# Patient Record
Sex: Male | Born: 1965
Health system: Southern US, Community
[De-identification: ages and names within clinical notes are randomized; demographics above are authoritative.]

## PROBLEM LIST (undated history)

## (undated) DIAGNOSIS — E785 Hyperlipidemia, unspecified: Secondary | ICD-10-CM

## (undated) DIAGNOSIS — K219 Gastro-esophageal reflux disease without esophagitis: Secondary | ICD-10-CM

## (undated) DIAGNOSIS — I2510 Atherosclerotic heart disease of native coronary artery without angina pectoris: Secondary | ICD-10-CM

## (undated) DIAGNOSIS — I10 Essential (primary) hypertension: Secondary | ICD-10-CM

## (undated) DIAGNOSIS — I82409 Acute embolism and thrombosis of unspecified deep veins of unspecified lower extremity: Secondary | ICD-10-CM

## (undated) DIAGNOSIS — Z86711 Personal history of pulmonary embolism: Secondary | ICD-10-CM

## (undated) DIAGNOSIS — I440 Atrioventricular block, first degree: Secondary | ICD-10-CM

## (undated) DIAGNOSIS — J309 Allergic rhinitis, unspecified: Secondary | ICD-10-CM

## (undated) DIAGNOSIS — E8881 Metabolic syndrome: Secondary | ICD-10-CM

## (undated) DIAGNOSIS — E119 Type 2 diabetes mellitus without complications: Secondary | ICD-10-CM

## (undated) DIAGNOSIS — A770 Spotted fever due to Rickettsia rickettsii: Secondary | ICD-10-CM

## (undated) HISTORY — DX: Allergic rhinitis, unspecified: J30.9

## (undated) HISTORY — DX: Atherosclerotic heart disease of native coronary artery without angina pectoris: I25.10

## (undated) HISTORY — DX: Spotted fever due to Rickettsia rickettsii: A77.0

## (undated) HISTORY — DX: Metabolic syndrome: E88.81

## (undated) HISTORY — PX: TIBIA FRACTURE SURGERY: SHX806

## (undated) HISTORY — DX: Type 2 diabetes mellitus without complications: E11.9

## (undated) HISTORY — DX: Atrioventricular block, first degree: I44.0

## (undated) HISTORY — DX: Gastro-esophageal reflux disease without esophagitis: K21.9

## (undated) HISTORY — DX: Personal history of pulmonary embolism: Z86.711

## (undated) HISTORY — DX: Metabolic syndrome: E88.810

---

## 2001-05-08 ENCOUNTER — Inpatient Hospital Stay (HOSPITAL_COMMUNITY): Admission: RE | Admit: 2001-05-08 | Discharge: 2001-05-11 | Payer: Self-pay | Admitting: *Deleted

## 2002-04-24 ENCOUNTER — Encounter: Admission: RE | Admit: 2002-04-24 | Discharge: 2002-05-17 | Payer: Self-pay | Admitting: Internal Medicine

## 2002-07-03 ENCOUNTER — Encounter: Admission: RE | Admit: 2002-07-03 | Discharge: 2002-10-01 | Payer: Self-pay | Admitting: Internal Medicine

## 2003-06-25 ENCOUNTER — Encounter: Admission: RE | Admit: 2003-06-25 | Discharge: 2003-09-23 | Payer: Self-pay | Admitting: Internal Medicine

## 2007-01-27 ENCOUNTER — Emergency Department (HOSPITAL_COMMUNITY): Admission: EM | Admit: 2007-01-27 | Discharge: 2007-01-27 | Payer: Self-pay | Admitting: Emergency Medicine

## 2009-02-11 ENCOUNTER — Ambulatory Visit: Payer: Self-pay | Admitting: Vascular Surgery

## 2009-02-15 ENCOUNTER — Ambulatory Visit (HOSPITAL_COMMUNITY): Admission: RE | Admit: 2009-02-15 | Discharge: 2009-02-15 | Payer: Self-pay | Admitting: Internal Medicine

## 2009-02-15 ENCOUNTER — Ambulatory Visit: Payer: Self-pay | Admitting: Pulmonary Disease

## 2009-02-15 ENCOUNTER — Emergency Department (HOSPITAL_COMMUNITY): Admission: EM | Admit: 2009-02-15 | Discharge: 2009-02-15 | Payer: Self-pay | Admitting: Emergency Medicine

## 2009-10-03 ENCOUNTER — Ambulatory Visit: Payer: Self-pay | Admitting: Vascular Surgery

## 2010-11-09 LAB — DIFFERENTIAL
Basophils Absolute: 0.1 10*3/uL (ref 0.0–0.1)
Eosinophils Absolute: 0.1 10*3/uL (ref 0.0–0.7)
Eosinophils Relative: 2 % (ref 0–5)
Lymphocytes Relative: 36 % (ref 12–46)
Neutrophils Relative %: 57 % (ref 43–77)

## 2010-11-09 LAB — POCT I-STAT, CHEM 8
Creatinine, Ser: 0.9 mg/dL (ref 0.4–1.5)
Glucose, Bld: 111 mg/dL — ABNORMAL HIGH (ref 70–99)
HCT: 45 % (ref 39.0–52.0)
Hemoglobin: 15.3 g/dL (ref 13.0–17.0)
TCO2: 24 mmol/L (ref 0–100)

## 2010-11-09 LAB — CBC
HCT: 42.8 % (ref 39.0–52.0)
Hemoglobin: 14.6 g/dL (ref 13.0–17.0)
MCHC: 34.2 g/dL (ref 30.0–36.0)
MCV: 88.9 fL (ref 78.0–100.0)
Platelets: 192 10*3/uL (ref 150–400)
RBC: 4.82 MIL/uL (ref 4.22–5.81)
RDW: 12.8 % (ref 11.5–15.5)
WBC: 7.9 10*3/uL (ref 4.0–10.5)

## 2010-11-09 LAB — PROTIME-INR
INR: 1.3 (ref 0.00–1.49)
Prothrombin Time: 17.1 seconds — ABNORMAL HIGH (ref 11.6–15.2)

## 2010-12-16 NOTE — Procedures (Signed)
DUPLEX DEEP VENOUS EXAM - LOWER EXTREMITY   INDICATION:  Right lower extremity pain and swelling.   HISTORY:  Edema:  Yes  Trauma/Surgery:  No  Pain:  Yes  PE:  No  Previous DVT:  No  Anticoagulants:  Other:   DUPLEX EXAM:                CFV   SFV   PopV  PTV    GSV                R  L  R  L  R  L  R   L  R  L  Thrombosis    0  0  +     +     +      0  Spontaneous   +     +     +     +      +  Phasic        0     0     0     0      0  Augmentation  0     0     0     0      0  Compressible  0     0     0     0      0  Competent     0     0     0     0      0   Legend:  + - yes  o - no  p - partial  D - decreased   IMPRESSION:  Acute deep vein thrombosis noted in the right superficial  femoral vein, popliteal, peroneal, posterior tibial and gastrocnemius  veins.    _____________________________  Janetta Hora Fields, MD   MG/MEDQ  D:  02/11/2009  T:  02/12/2009  Job:  [id number]

## 2010-12-16 NOTE — Consult Note (Signed)
Adam Nunez, Adam Nunez                 ACCOUNT NO.:  [id number]   MEDICAL RECORD NO.:  [id number]          PATIENT TYPE:  EMS   LOCATION:  [facility]                         FACILITY:  [facility]   PHYSICIAN:  Tera Mater. Evlyn Kanner, M.D. DATE OF BIRTH:  1965-09-06   DATE OF CONSULTATION:  02/15/2009  DATE OF DISCHARGE:  02/15/2009                                 CONSULTATION   Mr. Cheryll Dessert is a 45 year old white male with newly discovered pulmonary  embolus.  He had been seen in our office on February 11, 2009, with leg  swelling and a vague feeling in his chest.  This had happened roughly  started 3 weeks ago from today.  When he was seen on February 11, 2009, he  had a prompt ultrasound done at [facility] which  showed acute DVT on the right, SFV, popliteal, peroneal, PTV, and  gastrocnemius veins.  In addition, he had a recent echocardiogram  showing EF of 55-60% with normal RV size and function, normal diastolic  function, and normal valves.  Today, he came in for a routine scheduled  CT just to look for pulmonary emboli and indeed multiple pulmonary  emboli were seen, specifically in the right and left main pulmonary  arteries, the left lower lobe branch, and right upper lobe,and also a CT  of the abdomen and pelvis was negative for any malignancy as was the CT  of the chest.  Upon questioning, he notes no pleuritic chest pain, no  hemoptysis, and no dyspnea.  His oxygen saturations have been fine while  observed here and he has had no arrhythmias.  He was seen briefly by Dr.  Vassie Loll of Pulmonary to help with management and possible consideration of  a research study and given all this history we have reevaluated the  situation and I think we will let him go home.  We discussed the fact  that the pulmonary emboli were still in the acute phase, but probably  happened 3 weeks ago.  There was no evidence of residual clot in the  proximal iliac veins that might go up to his lungs and he is  hemodynamically and physiologically stable without hypoxia.  He has  already been started on Lovenox 90 twice a day which has gotten for 4  days and he has been on Coumadin with an INR that is still  subtherapeutic.  The patient's father and girlfriend were in the room.  We had a long discussion about the natural history of pathophysiology  and risk of pulmonary emboli.  He does have a thrombophilia evaluation  that been sent to the lab, but is not back yet.  Given that this is not  malignancy related and he is hemodynamically stable, it is felt that in-  hospital therapy will be based for the same as out-of-hospital therapy.  He does not require any supplemental oxygen that might be modifying  factor, so at the present time I think we can let him go home with  continued outpatient therapy.  He is comfortable with this and will  proceed with this plan.  PAST MEDICAL HISTORY:  Type 2 diabetes in 2006 after IGT in 2003.  He  has hyperlipidemia and hypertension.  He has had [facility] spotted  fever in 2003.  He has had first-degree AV block previously.  He had a  right tib-fib fracture with IM nail in 2002.  Interestingly, he did not  have any clotting problems at that time.  He has a history of asthma and  gastroesophageal reflux disease.   SOCIAL HISTORY:  He is single with no tobacco use, rare alcohol.   ALLERGIES:  No known allergies.   FAMILY HISTORY:  Father is living and healthy with hypertension and knee  replacement.  Mother is healthy and living with hypertension.  Maternal  grandmother had type 2 diabetes.  He has a sister who is alive and well.  There have been no premature deaths from clotting disorders, bleeding,  or dysesthesias.   His medications include:  1. Crestor 10.  2. Benazepril 10.  3. Aspirin 81.  4. Zyrtec 10.  5. Mucinex.  6. Vitamin D.  7. Fish oil.  8. Metformin 500.  9. Lovenox and Coumadin which he has been on.   PHYSICAL EXAMINATION:  VITAL  SIGNS:  In the emergency room, blood  pressure 124/86, pulse is 86 and in sinus rhythm without PVCs or PACs,  his oxygen saturation is 96% on room air, and respiratory rate is 20 and  unlabored.  GENERAL:  We have a healthy-appearing white male, sitting up, in no  distress.  HEENT:  Sclerae are icteric.  Face is symmetric.  Extraocular movements  are intact.  There is no nystagmus.  Disks are sharp bilaterally.  Oral  mucous membranes are moist with no telangiectasia or evidence of  bleeding.  NECK:  Supple without thyromegaly, bruits, or JVD.  LUNGS:  Entirely clear with no wheezes, rales, or rhonchi.  I hear no  abnormal sounds and he is moving air well.  No accessory muscle in use.  HEART:  Regular with no systolic murmur or other extra sounds.  ABDOMEN:  Soft and nontender with no masses.  EXTREMITIES:  Strong distal pulses.  There is a very trace amount of  edema of his right calf, but this is very minimal.  NEURO:  The patient is awake and alert.  His mentation is perfectly  good.  Speech is clear.  No tremors present.   LABORATORY DATA:  PTT was 44 seconds.  INR is 1.3.  His hemoglobin is  15.3.  Sodium 140, potassium 4.1, chloride 105, BUN 13, creatinine 0.9,  and glucose of 111.  White count 7900, hemoglobin 14.6, and platelets  192,000.   In summary, we have a 45 year old white male with acute/subacute  pulmonary emboli who is already being managed with Lovenox and Coumadin.  We are going to let him go home to continue on his 90 twice a day of  Lovenox, to be limited in activity, to follow his diabetic diet.  He has  no pain control necessary.  He will be seen on Monday for another  protime.  He is to continue his Coumadin and his other medicines as  before.  He is to call if there are any bleeding problems, any acute  dyspnea, any dizziness, or any chest pain.  The patient agrees to this  and is comfortable with this plan.           ______________________________   Tera Mater. Evlyn Kanner, M.D.     SAS/MEDQ  D:  02/15/2009  T:  02/16/2009  Job:  [id number]

## 2010-12-16 NOTE — Procedures (Signed)
DUPLEX DEEP VENOUS EXAM - LOWER EXTREMITY   INDICATION:  Follow up right lower extremity deep venous thrombus.   HISTORY:  Edema:  No.  Trauma/Surgery:  No.  Pain:  No.  PE:  No.  Previous DVT:  Yes.  Anticoagulants:  Yes.  Other:  No.   DUPLEX EXAM:                CFV   SFV   PopV  PTV    GSV                R  L  R  L  R  L  R   L  R  L  Thrombosis    o  o  o     +     +      o  Spontaneous   +  +  +     o     +      +  Phasic        +  +  +     o     o      +  Augmentation  +  +  +     o     o      +  Compressible  +  +  +     P     o      +  Competent     +  +  +     o     o      +   Legend:  + - yes  o - no  p - partial  D - decreased   IMPRESSION:  The right leg appears to have chronic deep venous  thrombosis in the right popliteal and one posterior tibial vein.    _____________________________  Di Kindle. Edilia Bo, M.D.   CB/MEDQ  D:  10/03/2009  T:  10/03/2009  Job:  [id number]

## 2011-01-05 ENCOUNTER — Encounter (INDEPENDENT_AMBULATORY_CARE_PROVIDER_SITE_OTHER): Payer: 59

## 2011-01-05 DIAGNOSIS — M79609 Pain in unspecified limb: Secondary | ICD-10-CM

## 2011-01-14 NOTE — Procedures (Unsigned)
DUPLEX DEEP VENOUS EXAM - LOWER EXTREMITY  INDICATION:  Right lower extremity pain.  HISTORY:  Edema:  No. Trauma/Surgery:  No. Pain:  Yes. PE:  No. Previous DVT:  Right superficial femoral vein, popliteal and posterior tibial DVT 02/11/2009. Anticoagulants:  Currently taking warfarin. Other:  DUPLEX EXAM:               CFV   SFV   PopV  PTV    GSV               R  L  R  L  R  L  R   L  R  L Thrombosis    o  o  +  o  +  o  o   o  o Spontaneous   +  +  +  +  +  +  +   + Phasic        +  +  +  +  +  +  +   + Augmentation  +  +  +  +  +  +  +   + Compressible  +  +  P  +  P  +  +   + Competent     o  +  o  o  o  +  +   +  Legend:  + - yes  o - no  p - partial  D - decreased   IMPRESSION: 1. Evidence of chronic deep venous thrombosis in the right femoral     vein and popliteal vein showing partial compressibility. 2. This is consistent with the patient's medical history. 3. Results called to Dr. Laurey Morale office.      _____________________________ Di Kindle. Edilia Bo, M.D.  EM/MEDQ  D:  01/06/2011  T:  01/06/2011  Job:  [id number]

## 2011-07-31 ENCOUNTER — Emergency Department
Admission: EM | Admit: 2011-07-31 | Discharge: 2011-07-31 | Disposition: A | Discharge status: Home / Self Care | Payer: 59 | Source: Home / Self Care | Attending: Family Medicine | Admitting: Family Medicine

## 2011-07-31 ENCOUNTER — Encounter: Payer: Self-pay | Admitting: *Deleted

## 2011-07-31 DIAGNOSIS — J069 Acute upper respiratory infection, unspecified: Secondary | ICD-10-CM

## 2011-07-31 HISTORY — DX: Essential (primary) hypertension: I10

## 2011-07-31 HISTORY — DX: Hyperlipidemia, unspecified: E78.5

## 2011-07-31 HISTORY — DX: Acute embolism and thrombosis of unspecified deep veins of unspecified lower extremity: I82.409

## 2011-07-31 MED ORDER — AMOXICILLIN 875 MG PO TABS: 875.0000 mg | ORAL_TABLET | Freq: Two times a day (BID) | ORAL | Status: AC

## 2011-07-31 MED ORDER — BENZONATATE 200 MG PO CAPS: 200.0000 mg | ORAL_CAPSULE | Freq: Every day | ORAL | Status: AC

## 2011-07-31 NOTE — ED Notes (Signed)
Pt c/o productive cough, nasal congestion and chest congestion x 4 days. Denies fever. He has taken Mucinex.

## 2011-07-31 NOTE — Discharge Instructions (Signed)
Continue Mucinex twice daily for congestion.  Increase fluid intake, rest. May use Afrin nasal spray (or generic oxymetazoline) twice daily for about 5 days.  Also recommend using saline nasal spray several times daily and/or saline nasal irrigation. Stop all antihistamines for now, and other non-prescription cough/cold preparations. Begin Amoxicillin if not improving about 5 days or if persistent fever develops. Follow-up with family doctor if not improving 7 to 10 days.

## 2011-07-31 NOTE — ED Provider Notes (Signed)
History     CSN: [id number]  Arrival date & time 07/31/11  0910   First MD Initiated Contact with Patient 07/31/11 1042      Chief Complaint  Patient presents with  . Nasal Congestion  . Cough      HPI Comments: Patient complains of approximately 4 day history of gradually progressive URI symptoms beginning with a mild sore throat (now improved), followed by progressive nasal congestion.  A cough started about 3 days ago.  Complains of fatigue but no myalgias.  Cough is now worse at night and generally productive during the day.  There has been no pleuritic pain or shortness of breath.  He does complain of wheezing at times. He has had pneumonia in the distant past.  He had a flu shot earlier this month.  He had a DVT and PE about two years ago.   Patient is a 45 y.o. male presenting with cough. The history is provided by the patient.  Cough This is a new problem. Episode onset: 4 days ago. The problem occurs every few minutes. The problem has been gradually worsening. The cough is productive of sputum. There has been no fever. Associated symptoms include ear congestion, headaches, rhinorrhea, sore throat and wheezing. Pertinent negatives include no chest pain, no chills, no sweats, no ear pain, no myalgias, no shortness of breath and no eye redness. He has tried cough syrup for the symptoms. The treatment provided mild relief. He is not a smoker. His past medical history is significant for pneumonia.    Past Medical History  Diagnosis Date  . DVT (deep venous thrombosis)   . Hypertension   . Diabetes mellitus   . Hyperlipemia     Past Surgical History  Procedure Date  . Tibia fracture surgery     Family History  Problem Relation Age of Onset  . Hypertension Mother   . Hypertension Father     History  Substance Use Topics  . Smoking status: Never Smoker   . Smokeless tobacco: Not on file  . Alcohol Use: No      Review of Systems  Constitutional: Negative for  chills.  HENT: Positive for sore throat and rhinorrhea. Negative for ear pain.   Eyes: Negative for redness.  Respiratory: Positive for cough and wheezing. Negative for shortness of breath.   Cardiovascular: Negative for chest pain.  Musculoskeletal: Negative for myalgias.  Neurological: Positive for headaches.   + sore throat (resolved) + cough No pleuritic pain ? wheezing + nasal congestion + post-nasal drainage No sinus pain/pressure No itchy/red eyes No earache No hemoptysis No SOB No fever/chills No nausea No vomiting No abdominal pain No diarrhea No urinary symptoms No skin rashes + fatigue Mild myalgias Mild headache Used OTC meds without relief  Allergies  Review of patient's allergies indicates no known allergies.  Home Medications   Current Outpatient Rx  Name Route Sig Dispense Refill  . BENAZEPRIL HCL 20 MG PO TABS Oral Take 20 mg by mouth daily.      Marland Kitchen SAXAGLIPTIN-METFORMIN 2.12-998 MG PO TB24 Oral Take by mouth.      . WARFARIN SODIUM 5 MG PO TABS Oral Take 5 mg by mouth daily.      . AMOXICILLIN 875 MG PO TABS Oral Take 1 tablet (875 mg total) by mouth 2 (two) times daily. (Rx void after 08/09/11) 20 tablet 0  . BENZONATATE 200 MG PO CAPS Oral Take 1 capsule (200 mg total) by mouth at bedtime. Take as  needed for cough 12 capsule 0    BP 118/84  Pulse 93  Temp(Src) 98 F (36.7 C) (Oral)  Resp 16  Ht 5\' 8"  (1.727 m)  Wt 197 lb 4 oz (89.472 kg)  BMI 29.99 kg/m2  SpO2 96%  Physical Exam Nursing notes and Vital Signs reviewed. Appearance:  Patient appears healthy, stated age, and in no acute distress Eyes:  Pupils are equal, round, and reactive to light and accomodation.  Extraocular movement is intact.  Conjunctivae are not inflamed  Ears:  Canals normal.  Tympanic membranes normal.  Nose:  Mildly congested turbinates.  No sinus tenderness.    Pharynx:  Normal Neck:  Supple.  Slightly tender shotty posterior nodes are palpated bilaterally  Lungs:   Clear to auscultation.  Breath sounds are equal.  Heart:  Regular rate and rhythm without murmurs, rubs, or gallops.  Abdomen:  Nontender without masses or hepatosplenomegaly.  Bowel sounds are present.  No CVA or flank tenderness.  Extremities:  No edema.  No calf tenderness Skin:  No rash present.   ED Course  Procedures none      1. Acute upper respiratory infections of unspecified site       MDM  There is no evidence of bacterial infection today.   Treat symptomatically for now: Continue Mucinex twice daily for congestion.  Increase fluid intake, rest. May use Afrin nasal spray (or generic oxymetazoline) twice daily for about 5 days.  Also recommend using saline nasal spray several times daily and/or saline nasal irrigation. Stop all antihistamines for now, and other non-prescription cough/cold preparations. Begin Amoxicillin if not improving about 5 days or if persistent fever develops. Follow-up with family doctor if not improving 7 to 10 days.        Donna Christen, MD 08/02/11 2236

## 2013-08-03 DIAGNOSIS — E7800 Pure hypercholesterolemia, unspecified: Secondary | ICD-10-CM | POA: Insufficient documentation

## 2013-08-03 DIAGNOSIS — E785 Hyperlipidemia, unspecified: Secondary | ICD-10-CM | POA: Insufficient documentation

## 2013-08-03 DIAGNOSIS — I10 Essential (primary) hypertension: Secondary | ICD-10-CM | POA: Insufficient documentation

## 2015-06-11 ENCOUNTER — Encounter: Payer: Self-pay | Admitting: Emergency Medicine

## 2015-06-11 ENCOUNTER — Emergency Department
Admission: EM | Admit: 2015-06-11 | Discharge: 2015-06-11 | Disposition: A | Discharge status: Home / Self Care | Payer: 59 | Source: Home / Self Care | Attending: Family Medicine | Admitting: Family Medicine

## 2015-06-11 DIAGNOSIS — H01006 Unspecified blepharitis left eye, unspecified eyelid: Secondary | ICD-10-CM

## 2015-06-11 MED ORDER — ERYTHROMYCIN 5 MG/GM OP OINT: TOPICAL_OINTMENT | OPHTHALMIC | Status: DC

## 2015-06-11 NOTE — Discharge Instructions (Signed)
Apply cool compress several times daily until swelling decreases.   Blepharitis Blepharitis is inflammation of the eyelids. Blepharitis may happen with:  Reddish, scaly skin around the scalp and eyebrows.  Burning or itching of the eyelids.  Eye discharge at night that causes the eyelashes to stick together in the morning.  Eyelashes that fall out.  Sensitivity to light. HOME CARE INSTRUCTIONS Pay attention to any changes in how you look or feel. Follow these instructions to help with your condition: Keeping Clean  Wash your hands often.  Wash your eyelids with warm water or with warm water that is mixed with a small amount of baby shampoo. Do this two times per day or as often as needed.  Wash your face and eyebrows at least once a day.  Use a clean towel each time you dry your eyelids. Do not use this towel to clean or dry other areas of your body. Do not share your towel with anyone. General Instructions  Avoid wearing makeup until you get better. Do not share makeup with anyone.  Avoid rubbing your eyes.  Apply warm compresses to your eyes 2 times per day for 10 minutes at a time, or as told by your health care provider.  If you were prescribed an antibiotic ointment or steroid drops, apply or use the medicine as told by your health care provider. Do not stop using the medicine even if you feel better.  Keep all follow-up visits as told by your health care provider. This is important. SEEK MEDICAL CARE IF:  Your eyelids feel hot.  You have blisters or a rash on your eyelids.  The condition does not go away in 2-4 days.  The inflammation gets worse. SEEK IMMEDIATE MEDICAL CARE IF:  You have pain or redness that gets worse or spreads to other parts of your face.  Your vision changes.  You have pain when looking at lights or moving objects.  You have a fever.   This information is not intended to replace advice given to you by your health care provider. Make  sure you discuss any questions you have with your health care provider.   Document Released: 07/17/2000 Document Revised: 04/10/2015 Document Reviewed: 11/12/2014 Elsevier Interactive Patient Education [employer].

## 2015-06-11 NOTE — ED Provider Notes (Signed)
CSN: [id number]     Arrival date & time 06/11/15  1105 History   First MD Initiated Contact with Patient 06/11/15 1201     Chief Complaint  Patient presents with  . Eye Problem      HPI Comments: Upon awakening yesterday patient noticed swelling and itching of his left upper eyelid that has persisted today.  No changes in vision.  No foreign body.  He had some crusting and drainage from his left eye this morning.  The history is provided by the patient.    Past Medical History  Diagnosis Date  . DVT (deep venous thrombosis) ([place])   . Hypertension   . Diabetes mellitus   . Hyperlipemia    Past Surgical History  Procedure Laterality Date  . Tibia fracture surgery     Family History  Problem Relation Age of Onset  . Hypertension Mother   . Hypertension Father    Social History  Substance Use Topics  . Smoking status: Never Smoker   . Smokeless tobacco: None  . Alcohol Use: No    Review of Systems  Constitutional: Negative for fever, chills, diaphoresis and fatigue.  HENT: Negative for congestion, ear pain, facial swelling, postnasal drip, rhinorrhea, sinus pressure and sore throat.   Eyes: Positive for discharge and itching. Negative for photophobia, pain, redness and visual disturbance.  Respiratory: Negative.   Cardiovascular: Negative.   Gastrointestinal: Negative.   Genitourinary: Negative.   Musculoskeletal: Negative.   Skin: Negative.   Neurological: Negative for headaches.    Allergies  Review of patient's allergies indicates not on file.  Home Medications   Prior to Admission medications   Medication Sig Start Date End Date Taking? Authorizing Provider  benazepril (LOTENSIN) 20 MG tablet Take 20 mg by mouth daily.      Historical Provider, MD  erythromycin ophthalmic ointment Place a 1/2 inch ribbon of ointment onto the upper eyelid margin q3 to 4hr 06/11/15   Kandra Nicolas, MD  Saxagliptin-Metformin (KOMBIGLYZE XR) 2.12-998 MG TB24 Take by mouth.       Historical Provider, MD  warfarin (COUMADIN) 5 MG tablet Take 5 mg by mouth daily.      Historical Provider, MD   Meds Ordered and Administered this Visit  Medications - No data to display  BP 135/83 mmHg  Pulse 70  Temp(Src) 98.4 F (36.9 C) (Oral)  Ht 5\' 8"  (1.727 m)  Wt 167 lb (75.751 kg)  BMI 25.40 kg/m2  SpO2 97% No data found.   Physical Exam  Constitutional: He is oriented to person, place, and time. He appears well-developed and well-nourished. No distress.  HENT:  Head: Normocephalic.  Right Ear: Tympanic membrane, external ear and ear canal normal.  Left Ear: Tympanic membrane, external ear and ear canal normal.  Nose: Nose normal.  Mouth/Throat: Oropharynx is clear and moist.  Eyes: Conjunctivae and EOM are normal. Pupils are equal, round, and reactive to light. Right eye exhibits no chemosis and no discharge. Left eye exhibits no chemosis, no discharge, no exudate and no hordeolum.    Left upper eyelid is edematous and mildly erythematous but nontender to palpation.  No periorbital tenderness to palpation or swelling.  No photophobia.  Fundi benign.  No chalazion or hordeolum palpated.   Neck: Neck supple.  Cardiovascular: Normal heart sounds.   Pulmonary/Chest: Breath sounds normal.  Lymphadenopathy:    He has no cervical adenopathy.  Neurological: He is alert and oriented to person, place, and time.  Skin: Skin is  warm and dry.  Nursing note and vitals reviewed.   ED Course  Procedures  none  MDM   1. Blepharitis of eyelid of left eye    Begin erythromycin ophthalmic ointment. Apply cool compress several times daily until swelling decreases. Followup with ophthalmologist if not improved 3 days.    Kandra Nicolas, MD 06/11/15 [phone]

## 2015-06-11 NOTE — ED Notes (Signed)
Left eye lid red swollen, painful. Woke up yesterday morning, crusty drainage, itchy, worse today

## 2015-09-05 DIAGNOSIS — E119 Type 2 diabetes mellitus without complications: Secondary | ICD-10-CM | POA: Diagnosis not present

## 2015-09-17 DIAGNOSIS — E784 Other hyperlipidemia: Secondary | ICD-10-CM | POA: Diagnosis not present

## 2015-09-17 DIAGNOSIS — Z0000 Encounter for general adult medical examination without abnormal findings: Secondary | ICD-10-CM | POA: Diagnosis not present

## 2015-09-17 DIAGNOSIS — Z125 Encounter for screening for malignant neoplasm of prostate: Secondary | ICD-10-CM | POA: Diagnosis not present

## 2015-09-17 DIAGNOSIS — E119 Type 2 diabetes mellitus without complications: Secondary | ICD-10-CM | POA: Diagnosis not present

## 2015-09-24 DIAGNOSIS — E784 Other hyperlipidemia: Secondary | ICD-10-CM | POA: Diagnosis not present

## 2015-09-24 DIAGNOSIS — J45909 Unspecified asthma, uncomplicated: Secondary | ICD-10-CM | POA: Diagnosis not present

## 2015-09-24 DIAGNOSIS — I2699 Other pulmonary embolism without acute cor pulmonale: Secondary | ICD-10-CM | POA: Diagnosis not present

## 2015-09-24 DIAGNOSIS — I459 Conduction disorder, unspecified: Secondary | ICD-10-CM | POA: Diagnosis not present

## 2015-09-24 DIAGNOSIS — Z0000 Encounter for general adult medical examination without abnormal findings: Secondary | ICD-10-CM | POA: Diagnosis not present

## 2015-09-24 DIAGNOSIS — Z1389 Encounter for screening for other disorder: Secondary | ICD-10-CM | POA: Diagnosis not present

## 2015-09-24 DIAGNOSIS — E119 Type 2 diabetes mellitus without complications: Secondary | ICD-10-CM | POA: Diagnosis not present

## 2015-09-24 DIAGNOSIS — I10 Essential (primary) hypertension: Secondary | ICD-10-CM | POA: Diagnosis not present

## 2015-09-24 DIAGNOSIS — I8290 Acute embolism and thrombosis of unspecified vein: Secondary | ICD-10-CM | POA: Diagnosis not present

## 2015-10-22 ENCOUNTER — Encounter: Payer: Self-pay | Admitting: Internal Medicine

## 2015-10-31 DIAGNOSIS — L710 Perioral dermatitis: Secondary | ICD-10-CM | POA: Diagnosis not present

## 2015-10-31 DIAGNOSIS — L719 Rosacea, unspecified: Secondary | ICD-10-CM | POA: Diagnosis not present

## 2015-11-21 DIAGNOSIS — L719 Rosacea, unspecified: Secondary | ICD-10-CM | POA: Diagnosis not present

## 2015-11-21 DIAGNOSIS — L710 Perioral dermatitis: Secondary | ICD-10-CM | POA: Diagnosis not present

## 2016-09-07 DIAGNOSIS — H5213 Myopia, bilateral: Secondary | ICD-10-CM | POA: Diagnosis not present

## 2016-09-28 DIAGNOSIS — E119 Type 2 diabetes mellitus without complications: Secondary | ICD-10-CM | POA: Diagnosis not present

## 2016-09-28 DIAGNOSIS — Z125 Encounter for screening for malignant neoplasm of prostate: Secondary | ICD-10-CM | POA: Diagnosis not present

## 2016-09-28 DIAGNOSIS — E784 Other hyperlipidemia: Secondary | ICD-10-CM | POA: Diagnosis not present

## 2016-09-28 DIAGNOSIS — I10 Essential (primary) hypertension: Secondary | ICD-10-CM | POA: Diagnosis not present

## 2016-09-28 DIAGNOSIS — Z0000 Encounter for general adult medical examination without abnormal findings: Secondary | ICD-10-CM | POA: Diagnosis not present

## 2016-10-05 DIAGNOSIS — E119 Type 2 diabetes mellitus without complications: Secondary | ICD-10-CM | POA: Diagnosis not present

## 2016-10-05 DIAGNOSIS — E784 Other hyperlipidemia: Secondary | ICD-10-CM | POA: Diagnosis not present

## 2016-10-05 DIAGNOSIS — I8290 Acute embolism and thrombosis of unspecified vein: Secondary | ICD-10-CM | POA: Diagnosis not present

## 2016-10-05 DIAGNOSIS — Z1389 Encounter for screening for other disorder: Secondary | ICD-10-CM | POA: Diagnosis not present

## 2016-10-05 DIAGNOSIS — I4589 Other specified conduction disorders: Secondary | ICD-10-CM | POA: Diagnosis not present

## 2016-10-05 DIAGNOSIS — I2699 Other pulmonary embolism without acute cor pulmonale: Secondary | ICD-10-CM | POA: Diagnosis not present

## 2016-10-05 DIAGNOSIS — I10 Essential (primary) hypertension: Secondary | ICD-10-CM | POA: Diagnosis not present

## 2016-10-05 DIAGNOSIS — J45998 Other asthma: Secondary | ICD-10-CM | POA: Diagnosis not present

## 2016-10-05 DIAGNOSIS — M79604 Pain in right leg: Secondary | ICD-10-CM | POA: Diagnosis not present

## 2016-10-05 DIAGNOSIS — Z0000 Encounter for general adult medical examination without abnormal findings: Secondary | ICD-10-CM | POA: Diagnosis not present

## 2016-12-17 DIAGNOSIS — Z1212 Encounter for screening for malignant neoplasm of rectum: Secondary | ICD-10-CM | POA: Diagnosis not present

## 2016-12-17 DIAGNOSIS — Z1211 Encounter for screening for malignant neoplasm of colon: Secondary | ICD-10-CM | POA: Diagnosis not present

## 2017-04-06 DIAGNOSIS — E785 Hyperlipidemia, unspecified: Secondary | ICD-10-CM | POA: Diagnosis not present

## 2017-04-06 DIAGNOSIS — E784 Other hyperlipidemia: Secondary | ICD-10-CM | POA: Diagnosis not present

## 2017-04-13 DIAGNOSIS — Z6827 Body mass index (BMI) 27.0-27.9, adult: Secondary | ICD-10-CM | POA: Diagnosis not present

## 2017-04-13 DIAGNOSIS — J45998 Other asthma: Secondary | ICD-10-CM | POA: Diagnosis not present

## 2017-04-13 DIAGNOSIS — E119 Type 2 diabetes mellitus without complications: Secondary | ICD-10-CM | POA: Diagnosis not present

## 2017-04-13 DIAGNOSIS — E784 Other hyperlipidemia: Secondary | ICD-10-CM | POA: Diagnosis not present

## 2017-04-13 DIAGNOSIS — I10 Essential (primary) hypertension: Secondary | ICD-10-CM | POA: Diagnosis not present

## 2017-04-13 DIAGNOSIS — I2699 Other pulmonary embolism without acute cor pulmonale: Secondary | ICD-10-CM | POA: Diagnosis not present

## 2017-04-13 DIAGNOSIS — I8290 Acute embolism and thrombosis of unspecified vein: Secondary | ICD-10-CM | POA: Diagnosis not present

## 2017-08-11 DIAGNOSIS — I10 Essential (primary) hypertension: Secondary | ICD-10-CM | POA: Diagnosis not present

## 2017-08-11 DIAGNOSIS — E119 Type 2 diabetes mellitus without complications: Secondary | ICD-10-CM | POA: Diagnosis not present

## 2017-08-11 DIAGNOSIS — Z6827 Body mass index (BMI) 27.0-27.9, adult: Secondary | ICD-10-CM | POA: Diagnosis not present

## 2017-08-11 DIAGNOSIS — I8290 Acute embolism and thrombosis of unspecified vein: Secondary | ICD-10-CM | POA: Diagnosis not present

## 2017-10-28 DIAGNOSIS — E119 Type 2 diabetes mellitus without complications: Secondary | ICD-10-CM | POA: Diagnosis not present

## 2017-10-28 DIAGNOSIS — R82998 Other abnormal findings in urine: Secondary | ICD-10-CM | POA: Diagnosis not present

## 2017-10-28 DIAGNOSIS — Z0000 Encounter for general adult medical examination without abnormal findings: Secondary | ICD-10-CM | POA: Diagnosis not present

## 2017-10-28 DIAGNOSIS — Z125 Encounter for screening for malignant neoplasm of prostate: Secondary | ICD-10-CM | POA: Diagnosis not present

## 2017-11-04 DIAGNOSIS — E119 Type 2 diabetes mellitus without complications: Secondary | ICD-10-CM | POA: Diagnosis not present

## 2017-11-04 DIAGNOSIS — R040 Epistaxis: Secondary | ICD-10-CM | POA: Diagnosis not present

## 2017-11-04 DIAGNOSIS — H9319 Tinnitus, unspecified ear: Secondary | ICD-10-CM | POA: Diagnosis not present

## 2017-11-04 DIAGNOSIS — R1030 Lower abdominal pain, unspecified: Secondary | ICD-10-CM | POA: Diagnosis not present

## 2017-11-04 DIAGNOSIS — I8290 Acute embolism and thrombosis of unspecified vein: Secondary | ICD-10-CM | POA: Diagnosis not present

## 2017-11-04 DIAGNOSIS — I2699 Other pulmonary embolism without acute cor pulmonale: Secondary | ICD-10-CM | POA: Diagnosis not present

## 2017-11-04 DIAGNOSIS — Z0000 Encounter for general adult medical examination without abnormal findings: Secondary | ICD-10-CM | POA: Diagnosis not present

## 2017-11-04 DIAGNOSIS — J45909 Unspecified asthma, uncomplicated: Secondary | ICD-10-CM | POA: Diagnosis not present

## 2017-11-04 DIAGNOSIS — R21 Rash and other nonspecific skin eruption: Secondary | ICD-10-CM | POA: Diagnosis not present

## 2017-11-04 DIAGNOSIS — Z1389 Encounter for screening for other disorder: Secondary | ICD-10-CM | POA: Diagnosis not present

## 2017-11-05 DIAGNOSIS — Z1212 Encounter for screening for malignant neoplasm of rectum: Secondary | ICD-10-CM | POA: Diagnosis not present

## 2018-03-15 ENCOUNTER — Ambulatory Visit: Payer: Self-pay | Admitting: Family Medicine

## 2018-03-15 VITALS — BP 130/80 | HR 89 | Temp 98.2°F | Resp 16 | Wt 191.2 lb

## 2018-03-15 DIAGNOSIS — H6690 Otitis media, unspecified, unspecified ear: Secondary | ICD-10-CM

## 2018-03-15 MED ORDER — CETIRIZINE HCL 10 MG PO TABS: 10.0000 mg | ORAL_TABLET | Freq: Every day | ORAL | 0 refills | Status: DC

## 2018-03-15 MED ORDER — AMOXICILLIN-POT CLAVULANATE 875-125 MG PO TABS: 1.0000 | ORAL_TABLET | Freq: Two times a day (BID) | ORAL | 0 refills | Status: DC

## 2018-03-15 NOTE — Progress Notes (Signed)
Adam Nunez is a 52 y.o. male who presents today with concerns of ear pain x 2 days. He denies any known trauma. He reports 3 other episodes of diagnosed ear infections as an adult that began with similar presentation and he reports wanting to avoid the severe discomfort associated with these infections that are allowed to linger. He reports 2 weeks ago that [place] did [place] but states that symptoms only began 2 days ago. He has not attempted to self treat and also complains of ear itching as a symptom.  Review of Systems  Constitutional: Negative for chills, fever and malaise/fatigue.  HENT: Positive for ear pain. Negative for congestion, ear discharge, sinus pain and sore throat.   Eyes: Negative.   Respiratory: Negative for cough, sputum production and shortness of breath.   Cardiovascular: Negative.  Negative for chest pain.  Gastrointestinal: Negative for abdominal pain, diarrhea, nausea and vomiting.  Genitourinary: Negative for dysuria, frequency, hematuria and urgency.  Musculoskeletal: Negative for myalgias.  Skin: Negative.   Neurological: Negative for headaches.  Endo/Heme/Allergies: Negative.   Psychiatric/Behavioral: Negative.     O: Vitals:   03/15/18 1544  BP: 130/80  Pulse: 89  Resp: 16  Temp: 98.2 F (36.8 C)  SpO2: 95%     Physical Exam  Constitutional: He is oriented to person, place, and time. Vital signs are normal. He appears well-developed and well-nourished. He is active.  Non-toxic appearance. He does not have a sickly appearance.  HENT:  Head: Normocephalic.  Right Ear: Hearing, tympanic membrane, external ear and ear canal normal.  Left Ear: Hearing, external ear and ear canal normal. Tympanic membrane is injected, erythematous and bulging. A middle ear effusion is present.  Nose: Nose normal.  Mouth/Throat: Uvula is midline and oropharynx is clear and moist.  Neck: Normal range of motion. Neck supple.  Cardiovascular:  Normal rate, regular rhythm, normal heart sounds and normal pulses.  Pulmonary/Chest: Effort normal and breath sounds normal.  Abdominal: Soft. Bowel sounds are normal.  Musculoskeletal: Normal range of motion.  Lymphadenopathy:       Head (right side): No submental and no submandibular adenopathy present.       Head (left side): Tonsillar adenopathy present. No submental and no submandibular adenopathy present.    He has no cervical adenopathy.  Neurological: He is alert and oriented to person, place, and time.  Psychiatric: He has a normal mood and affect.  Vitals reviewed.  A: 1. Acute otitis media, unspecified otitis media type    P: Discussed exam findings, diagnosis etiology and medication use and indications reviewed with patient. Follow- Up and discharge instructions provided. No emergent/urgent issues found on exam.  Patient verbalized understanding of information provided and agrees with plan of care (POC), all questions answered.  1. Acute otitis media, unspecified otitis media type - amoxicillin-clavulanate (AUGMENTIN) 875-125 MG tablet; Take 1 tablet by mouth 2 (two) times daily. - cetirizine (ZYRTEC) 10 MG tablet; Take 1 tablet (10 mg total) by mouth daily.

## 2018-03-15 NOTE — Patient Instructions (Signed)

## 2018-08-24 ENCOUNTER — Ambulatory Visit: Payer: Self-pay | Admitting: Physician Assistant

## 2018-08-24 ENCOUNTER — Encounter: Payer: Self-pay | Admitting: Physician Assistant

## 2018-08-24 VITALS — BP 110/82 | HR 97 | Temp 97.5°F | Resp 18 | Wt 186.0 lb

## 2018-08-24 DIAGNOSIS — R0981 Nasal congestion: Secondary | ICD-10-CM

## 2018-08-24 DIAGNOSIS — J209 Acute bronchitis, unspecified: Secondary | ICD-10-CM

## 2018-08-24 MED ORDER — PROMETHAZINE-DM 6.25-15 MG/5ML PO SYRP: 5.0000 mL | ORAL_SOLUTION | Freq: Four times a day (QID) | ORAL | 0 refills | Status: DC | PRN

## 2018-08-24 MED ORDER — BENZONATATE 100 MG PO CAPS: 100.0000 mg | ORAL_CAPSULE | Freq: Three times a day (TID) | ORAL | 0 refills | Status: DC | PRN

## 2018-08-24 MED ORDER — ALBUTEROL SULFATE HFA 108 (90 BASE) MCG/ACT IN AERS: 2.0000 | INHALATION_SPRAY | RESPIRATORY_TRACT | 0 refills | Status: DC | PRN

## 2018-08-24 MED ORDER — PREDNISONE 20 MG PO TABS: 40.0000 mg | ORAL_TABLET | Freq: Every day | ORAL | 0 refills | Status: AC

## 2018-08-24 MED ORDER — FLUTICASONE PROPIONATE 50 MCG/ACT NA SUSP: 2.0000 | Freq: Every day | NASAL | 0 refills | Status: DC

## 2018-08-24 NOTE — Progress Notes (Signed)
MRN: [id number] DOB: 1966-07-14  Subjective:   Adam Nunez is a 53 y.o. male presenting for chief complaint of illness x 6 days. Started out with scratchy throat and dry cough.  Then developed nasal congestion, sinus pressure, and worsening cough. Has associated chest congestion and wheezing (worse at night).  Denies ear drainage, sore throat, inability to swallow, voice change, productive cough, shortness of breath, dyspnea, on exertion, chest tightness, chest pain and myalgia, night sweats, chills, nausea, vomiting, abdominal pain and diarrhea. Has tried mucinex and afrin with some relief. No known sick contact exposure. Has PMH of seasonal allergies-not taking antihistamine at this time; asthma as a child-not on any inhalers; well controlled HTN and DM-last A1C was "6 something."  Denies smoking. Patient had flu shot this season. Denies any other aggravating or relieving factors, no other questions or concerns.  Review of Systems  Constitutional: Negative for diaphoresis.  Respiratory: Negative for hemoptysis and sputum production.   Cardiovascular: Negative for chest pain and palpitations.  Musculoskeletal: Negative for neck pain.  Skin: Negative for rash.  Neurological: Negative for dizziness.    Von has a current medication list which includes the following prescription(s): benazepril, benazepril, cetirizine, dextromethorphan-guaifenesin, eliquis, erythromycin, rosuvastatin, saxagliptin-metformin, albuterol, benzonatate, fluticasone, prednisone, and promethazine-dextromethorphan. Also has No Known Allergies.  Harrold  has a past medical history of Diabetes mellitus, DVT (deep venous thrombosis) ([place]), Hyperlipemia, and Hypertension. Also  has a past surgical history that includes Tibia fracture surgery.   Objective:   Vitals: BP 110/82 (BP Location: Left Arm, Patient Position: Sitting)   Pulse 97   Temp (!) 97.5 F (36.4 C) (Oral)   Resp 18   Wt 186 lb (84.4 kg)   SpO2 98%    BMI 28.28 kg/m   Physical Exam Vitals signs reviewed.  Constitutional:      General: He is not in acute distress.    Appearance: He is well-developed. He is not ill-appearing or toxic-appearing.  HENT:     Head: Normocephalic and atraumatic.     Right Ear: Tympanic membrane, ear canal and external ear normal.     Left Ear: Tympanic membrane, ear canal and external ear normal.     Nose: Mucosal edema (L>R) and congestion present.     Right Turbinates: Enlarged.     Left Turbinates: Enlarged.     Right Sinus: No maxillary sinus tenderness or frontal sinus tenderness.     Left Sinus: No maxillary sinus tenderness or frontal sinus tenderness.     Mouth/Throat:     Lips: Pink.     Mouth: Mucous membranes are moist.     Pharynx: Oropharynx is clear. Uvula midline. No pharyngeal swelling, oropharyngeal exudate, posterior oropharyngeal erythema or uvula swelling.     Tonsils: No tonsillar exudate or tonsillar abscesses.  Eyes:     Conjunctiva/sclera: Conjunctivae normal.  Neck:     Musculoskeletal: Normal range of motion.  Cardiovascular:     Rate and Rhythm: Normal rate and regular rhythm.     Heart sounds: Normal heart sounds.  Pulmonary:     Effort: Pulmonary effort is normal. No tachypnea, accessory muscle usage or respiratory distress.     Breath sounds: Wheezing (few wheezes noted in b/l posterior lung fields w/ force expiratory breathing) and rhonchi (few scattered rhonchi in b/l posterior lung fields, cleared with cough) present. No decreased breath sounds or rales.  Lymphadenopathy:     Head:     Right side of head: No submental, submandibular, tonsillar, preauricular,  posterior auricular or occipital adenopathy.     Left side of head: No submental, submandibular, tonsillar, preauricular, posterior auricular or occipital adenopathy.     Cervical: No cervical adenopathy.     Upper Body:     Right upper body: No supraclavicular adenopathy.     Left upper body: No  supraclavicular adenopathy.  Skin:    General: Skin is warm and dry.  Neurological:     Mental Status: He is alert.     No results found for this or any previous visit (from the past 24 hour(s)).  Assessment and Plan :  1. Acute bronchitis with bronchospasm Pt is overall well appearing, NAD. VSS. He is afebrile.  SpO2 98%. Few wheezes noted with forced expiratory breathing and few scattered rhonchi noted on exam. No rales noted. No SOB, DOE, or chest pain noted. Likely viral bronchitis w/ bronchospasm. No concern for bacterial etiology at this time. Rec oral prednisone, albuterol inhaler prn, and symptomatic management. Exam findings, diagnosis etiology, medication use, indications, side effects, risks, benefits, and alternatives of the medications and treatment plan prescribed today and reviewed with patient. Follow- Up and discharge instructions provided. No emergent/urgent issues found on exam. Patient education was provided. Patient verbalized understanding of information provided and agrees with plan of care (POC), all questions answered. No barriers to understanding were identified. Red flags discussed in detail. The patient is advised to follow up with family doctor if condition does not see an improvement in symptoms, or to seek the care of the closest emergency department if condition worsens with the above plan.  - predniSONE (DELTASONE) 20 MG tablet; Take 2 tablets (40 mg total) by mouth daily with breakfast for 5 days.  Dispense: 10 tablet; Refill: 0 - benzonatate (TESSALON) 100 MG capsule; Take 1-2 capsules (100-200 mg total) by mouth 3 (three) times daily as needed for cough.  Dispense: 40 capsule; Refill: 0 - albuterol (PROVENTIL HFA;VENTOLIN HFA) 108 (90 Base) MCG/ACT inhaler; Inhale 2 puffs into the lungs every 4 (four) hours as needed for wheezing or shortness of breath (cough, shortness of breath or wheezing.).  Dispense: 1 Inhaler; Refill: 0 - promethazine-dextromethorphan  (PROMETHAZINE-DM) 6.25-15 MG/5ML syrup; Take 5 mLs by mouth 4 (four) times daily as needed for cough.  Dispense: 118 mL; Refill: 0  2. Nasal congestion With hx of underlying seasonal allergies and degree of nasal congestion today, concern for viral sinusitis. Not experiencing TTP of sinus cavities today, which is reassuring.  Rec flonase, nasal saline rinse, and oral antihistamine. Will likely have improvement with oral pred as well. If sinus pressure worsens or does not improve over the next 5-7 days, encouraged pt to contact our office. Could consider Rx for abx at that time to cover for bacterial etiology. Pt voices understanding.  - fluticasone (FLONASE) 50 MCG/ACT nasal spray; Place 2 sprays into both nostrils daily.  Dispense: 16 g; Refill: [place], [facility] 08/24/2018 11:40 AM

## 2018-08-24 NOTE — Patient Instructions (Signed)
Acute Bronchitis, Adult   Thank you for choosing [facility] for your health care needs.   You have been diagnosed with bronchitis (chest cold) with bronchospasm (wheezing).   Recommend increase fluids; water, Gatorade, or hot tea with water or lemon. May use humidifier or vaporizer in bedroom. Use several pillows to prop self up at night, may help with coughing and sleeping. Rest.   You have been prescribed a steroid, prednisone. 2 tablets once a day x 5 days. Prednisone is a steroid and can cause side effects such as headache, irritability, nausea, vomiting, increased heart rate, increased blood pressure, increased blood sugar, appetite changes, and insomnia. Please take tablets in the morning with a full meal to help decrease the chances of these side effects.     You have been prescribed an inhaler. Use 2 puffs every 4-6 hours (when awake) for cough/wheezing.   You have been prescribed a prescription cough syrup, Phenergan-DM. Use at night to help with cough and sleeping.  You have been given tesslon perles to use to stop the cough.  You have been given flonase for nasal congestion. I recommend taking an oral antihistamine as well. You can also do over the counter nasal saline rinses at night time. If sinus congestion does not resolve after 5-7 days, please contact our office as you may need an antibiotic at that time.   If your cough worsens with our treatment plan, please follow up with your family doctor. Seek care sooner if you develop any new concerning symptoms.    Hope you feel better soon.    Acute bronchitis is when air tubes (bronchi) in the lungs suddenly get swollen. The condition can make it hard to breathe. It can also cause these symptoms:  A cough.  Coughing up clear, yellow, or green mucus.  Wheezing.  Chest congestion.  Shortness of breath.  A fever.  Body aches.  Chills.  A sore throat. Follow these instructions at home:  Medicines  Take  over-the-counter and prescription medicines only as told by your doctor.  If you were prescribed an antibiotic medicine, take it as told by your doctor. Do not stop taking the antibiotic even if you start to feel better. General instructions  Rest.  Drink enough fluids to keep your pee (urine) pale yellow.  Avoid smoking and secondhand smoke. If you smoke and you need help quitting, ask your doctor. Quitting will help your lungs heal faster.  Use an inhaler, cool mist vaporizer, or humidifier as told by your doctor.  Keep all follow-up visits as told by your doctor. This is important. How is this prevented? To lower your risk of getting this condition again:  Wash your hands often with soap and water. If you cannot use soap and water, use hand sanitizer.  Avoid contact with people who have cold symptoms.  Try not to touch your hands to your mouth, nose, or eyes.  Make sure to get the flu shot every year. Contact a doctor if:  Your symptoms do not get better in 2 weeks. Get help right away if:  You cough up blood.  You have chest pain.  You have very bad shortness of breath.  You become dehydrated.  You faint (pass out) or keep feeling like you are going to pass out.  You keep throwing up (vomiting).  You have a very bad headache.  Your fever or chills gets worse. This information is not intended to replace advice given to you by your health care  provider. Make sure you discuss any questions you have with your health care provider. Document Released: 01/06/2008 Document Revised: 03/03/2017 Document Reviewed: 01/08/2016 Elsevier Interactive Patient Education  2019 [employer].

## 2018-08-26 ENCOUNTER — Telehealth: Payer: Self-pay

## 2018-08-26 NOTE — Telephone Encounter (Signed)
Patient states the medication the provider put him on is working and he is going to clal [place] back in 2-3 days, to tel [place] about his improvement.

## 2018-11-25 DIAGNOSIS — Z0000 Encounter for general adult medical examination without abnormal findings: Secondary | ICD-10-CM | POA: Diagnosis not present

## 2018-11-25 DIAGNOSIS — I10 Essential (primary) hypertension: Secondary | ICD-10-CM | POA: Diagnosis not present

## 2018-11-25 DIAGNOSIS — E119 Type 2 diabetes mellitus without complications: Secondary | ICD-10-CM | POA: Diagnosis not present

## 2018-11-25 DIAGNOSIS — Z125 Encounter for screening for malignant neoplasm of prostate: Secondary | ICD-10-CM | POA: Diagnosis not present

## 2018-12-02 DIAGNOSIS — I10 Essential (primary) hypertension: Secondary | ICD-10-CM | POA: Diagnosis not present

## 2018-12-02 DIAGNOSIS — Z1331 Encounter for screening for depression: Secondary | ICD-10-CM | POA: Diagnosis not present

## 2018-12-02 DIAGNOSIS — Z0000 Encounter for general adult medical examination without abnormal findings: Secondary | ICD-10-CM | POA: Diagnosis not present

## 2018-12-02 DIAGNOSIS — I459 Conduction disorder, unspecified: Secondary | ICD-10-CM | POA: Diagnosis not present

## 2018-12-02 DIAGNOSIS — E119 Type 2 diabetes mellitus without complications: Secondary | ICD-10-CM | POA: Diagnosis not present

## 2018-12-02 DIAGNOSIS — E785 Hyperlipidemia, unspecified: Secondary | ICD-10-CM | POA: Diagnosis not present

## 2018-12-02 DIAGNOSIS — I8290 Acute embolism and thrombosis of unspecified vein: Secondary | ICD-10-CM | POA: Diagnosis not present

## 2018-12-02 DIAGNOSIS — I2699 Other pulmonary embolism without acute cor pulmonale: Secondary | ICD-10-CM | POA: Diagnosis not present

## 2019-01-05 ENCOUNTER — Encounter: Payer: Self-pay | Admitting: Podiatry

## 2019-01-05 ENCOUNTER — Ambulatory Visit: Payer: 59 | Admitting: Podiatry

## 2019-01-05 ENCOUNTER — Other Ambulatory Visit: Payer: Self-pay

## 2019-01-05 VITALS — BP 129/85 | HR 69 | Temp 97.3°F | Resp 16

## 2019-01-05 DIAGNOSIS — L600 Ingrowing nail: Secondary | ICD-10-CM

## 2019-01-05 NOTE — Progress Notes (Signed)
   Subjective:    Patient ID: Adam Nunez, male    DOB: [birth year], 53 y.o.   MRN: [id number]  HPI    Review of Systems  All other systems reviewed and are negative.      Objective:   Physical Exam        Assessment & Plan:

## 2019-01-06 NOTE — Progress Notes (Signed)
Subjective:   Patient ID: Adam Nunez, male   DOB: 53 y.o.   MRN: [id number]   HPI Patient presents concerned about some discoloration in his third nail left with history of diabetes and wants to make sure there is no issues as far as that goes.  Patient does not smoke likes to be active   Review of Systems  All other systems reviewed and are negative.       Objective:  Physical Exam Vitals signs and nursing note reviewed.  Constitutional:      Appearance: He is well-developed.  Pulmonary:     Effort: Pulmonary effort is normal.  Musculoskeletal: Normal range of motion.  Skin:    General: Skin is warm.  Neurological:     Mental Status: He is alert.     Thorough neurovascular status checked and found to be within normal limits.  Patient is found to have discoloration of the proximal nail fold third nail left that is localized with no drainage noted or proximal edema erythema noted at this time     Assessment:  Traumatized third nail left with discoloration but no indications of pathology     Plan:  H&P revealed and reviewed trauma with him and discussed that it should grow out normally but if redness drainage or pain were to occur he is to reappoint immediately and I did find his diabetic foot exam to indicate that there appears to be no current issues

## 2019-03-30 DIAGNOSIS — E119 Type 2 diabetes mellitus without complications: Secondary | ICD-10-CM | POA: Diagnosis not present

## 2019-03-30 DIAGNOSIS — H35033 Hypertensive retinopathy, bilateral: Secondary | ICD-10-CM | POA: Diagnosis not present

## 2019-03-30 DIAGNOSIS — H0102A Squamous blepharitis right eye, upper and lower eyelids: Secondary | ICD-10-CM | POA: Diagnosis not present

## 2019-03-30 DIAGNOSIS — H11153 Pinguecula, bilateral: Secondary | ICD-10-CM | POA: Diagnosis not present

## 2019-03-30 DIAGNOSIS — H3554 Dystrophies primarily involving the retinal pigment epithelium: Secondary | ICD-10-CM | POA: Diagnosis not present

## 2019-03-30 DIAGNOSIS — H0102B Squamous blepharitis left eye, upper and lower eyelids: Secondary | ICD-10-CM | POA: Diagnosis not present

## 2019-03-30 DIAGNOSIS — H524 Presbyopia: Secondary | ICD-10-CM | POA: Diagnosis not present

## 2019-03-30 DIAGNOSIS — H5213 Myopia, bilateral: Secondary | ICD-10-CM | POA: Diagnosis not present

## 2019-10-18 DIAGNOSIS — M7662 Achilles tendinitis, left leg: Secondary | ICD-10-CM | POA: Diagnosis not present

## 2019-10-18 DIAGNOSIS — M542 Cervicalgia: Secondary | ICD-10-CM | POA: Diagnosis not present

## 2019-11-13 ENCOUNTER — Other Ambulatory Visit (HOSPITAL_COMMUNITY): Payer: Self-pay | Admitting: Internal Medicine

## 2019-11-21 DIAGNOSIS — L718 Other rosacea: Secondary | ICD-10-CM | POA: Diagnosis not present

## 2019-11-21 DIAGNOSIS — L309 Dermatitis, unspecified: Secondary | ICD-10-CM | POA: Diagnosis not present

## 2019-12-18 DIAGNOSIS — E119 Type 2 diabetes mellitus without complications: Secondary | ICD-10-CM | POA: Diagnosis not present

## 2019-12-18 DIAGNOSIS — Z125 Encounter for screening for malignant neoplasm of prostate: Secondary | ICD-10-CM | POA: Diagnosis not present

## 2019-12-18 DIAGNOSIS — E7849 Other hyperlipidemia: Secondary | ICD-10-CM | POA: Diagnosis not present

## 2019-12-18 DIAGNOSIS — Z0000 Encounter for general adult medical examination without abnormal findings: Secondary | ICD-10-CM | POA: Diagnosis not present

## 2019-12-27 ENCOUNTER — Other Ambulatory Visit (HOSPITAL_COMMUNITY): Payer: Self-pay | Admitting: Internal Medicine

## 2019-12-28 DIAGNOSIS — Z1331 Encounter for screening for depression: Secondary | ICD-10-CM | POA: Diagnosis not present

## 2019-12-28 DIAGNOSIS — I459 Conduction disorder, unspecified: Secondary | ICD-10-CM | POA: Diagnosis not present

## 2019-12-28 DIAGNOSIS — Z0000 Encounter for general adult medical examination without abnormal findings: Secondary | ICD-10-CM | POA: Diagnosis not present

## 2019-12-28 DIAGNOSIS — Z1339 Encounter for screening examination for other mental health and behavioral disorders: Secondary | ICD-10-CM | POA: Diagnosis not present

## 2019-12-28 DIAGNOSIS — R82998 Other abnormal findings in urine: Secondary | ICD-10-CM | POA: Diagnosis not present

## 2019-12-28 DIAGNOSIS — J45909 Unspecified asthma, uncomplicated: Secondary | ICD-10-CM | POA: Diagnosis not present

## 2019-12-28 DIAGNOSIS — H9319 Tinnitus, unspecified ear: Secondary | ICD-10-CM | POA: Diagnosis not present

## 2019-12-28 DIAGNOSIS — M79622 Pain in left upper arm: Secondary | ICD-10-CM | POA: Diagnosis not present

## 2019-12-28 DIAGNOSIS — M7662 Achilles tendinitis, left leg: Secondary | ICD-10-CM | POA: Diagnosis not present

## 2019-12-28 DIAGNOSIS — E1169 Type 2 diabetes mellitus with other specified complication: Secondary | ICD-10-CM | POA: Diagnosis not present

## 2019-12-28 DIAGNOSIS — R809 Proteinuria, unspecified: Secondary | ICD-10-CM | POA: Diagnosis not present

## 2019-12-28 DIAGNOSIS — J309 Allergic rhinitis, unspecified: Secondary | ICD-10-CM | POA: Diagnosis not present

## 2019-12-28 DIAGNOSIS — I10 Essential (primary) hypertension: Secondary | ICD-10-CM | POA: Diagnosis not present

## 2020-01-04 DIAGNOSIS — Z1212 Encounter for screening for malignant neoplasm of rectum: Secondary | ICD-10-CM | POA: Diagnosis not present

## 2020-01-24 ENCOUNTER — Other Ambulatory Visit: Payer: Self-pay

## 2020-01-24 ENCOUNTER — Ambulatory Visit (INDEPENDENT_AMBULATORY_CARE_PROVIDER_SITE_OTHER): Payer: 59 | Admitting: Neurology

## 2020-01-24 ENCOUNTER — Other Ambulatory Visit (HOSPITAL_COMMUNITY): Payer: Self-pay | Admitting: Internal Medicine

## 2020-01-24 ENCOUNTER — Encounter: Payer: Self-pay | Admitting: Neurology

## 2020-01-24 VITALS — BP 131/83 | HR 77 | Ht 68.5 in | Wt 183.0 lb

## 2020-01-24 DIAGNOSIS — E119 Type 2 diabetes mellitus without complications: Secondary | ICD-10-CM | POA: Diagnosis not present

## 2020-01-24 DIAGNOSIS — R351 Nocturia: Secondary | ICD-10-CM | POA: Diagnosis not present

## 2020-01-24 DIAGNOSIS — G478 Other sleep disorders: Secondary | ICD-10-CM | POA: Diagnosis not present

## 2020-01-24 DIAGNOSIS — I2782 Chronic pulmonary embolism: Secondary | ICD-10-CM | POA: Insufficient documentation

## 2020-01-24 DIAGNOSIS — M2619 Other specified anomalies of jaw-cranial base relationship: Secondary | ICD-10-CM

## 2020-01-24 NOTE — Patient Instructions (Signed)
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
## 2020-01-25 DIAGNOSIS — M7660 Achilles tendinitis, unspecified leg: Secondary | ICD-10-CM | POA: Insufficient documentation

## 2020-01-25 DIAGNOSIS — M67962 Unspecified disorder of synovium and tendon, left lower leg: Secondary | ICD-10-CM | POA: Diagnosis not present

## 2020-01-29 ENCOUNTER — Telehealth: Payer: Self-pay

## 2020-01-29 NOTE — Telephone Encounter (Signed)
LVM for pt to call me back to schedule sleep study  

## 2020-02-12 ENCOUNTER — Ambulatory Visit (INDEPENDENT_AMBULATORY_CARE_PROVIDER_SITE_OTHER): Payer: 59 | Admitting: Neurology

## 2020-02-12 DIAGNOSIS — G4733 Obstructive sleep apnea (adult) (pediatric): Secondary | ICD-10-CM

## 2020-02-12 DIAGNOSIS — R351 Nocturia: Secondary | ICD-10-CM

## 2020-02-12 DIAGNOSIS — G478 Other sleep disorders: Secondary | ICD-10-CM

## 2020-02-12 DIAGNOSIS — I2782 Chronic pulmonary embolism: Secondary | ICD-10-CM

## 2020-02-12 DIAGNOSIS — E119 Type 2 diabetes mellitus without complications: Secondary | ICD-10-CM

## 2020-02-12 DIAGNOSIS — M2619 Other specified anomalies of jaw-cranial base relationship: Secondary | ICD-10-CM

## 2020-02-13 DIAGNOSIS — M67969 Unspecified disorder of synovium and tendon, unspecified lower leg: Secondary | ICD-10-CM | POA: Diagnosis not present

## 2020-02-16 DIAGNOSIS — Z1211 Encounter for screening for malignant neoplasm of colon: Secondary | ICD-10-CM | POA: Diagnosis not present

## 2020-02-16 DIAGNOSIS — Z1212 Encounter for screening for malignant neoplasm of rectum: Secondary | ICD-10-CM | POA: Diagnosis not present

## 2020-03-04 ENCOUNTER — Telehealth: Payer: Self-pay | Admitting: Neurology

## 2020-03-04 NOTE — Procedures (Signed)
PATIENT'S NAME:  Adam Nunez, Adam Nunez DOB:      1966/05/04      MR#:    [id number]     DATE OF RECORDING: 02/12/2020 REFERRING M.D.:  Crist Infante MD Study Performed:   Baseline Polysomnogram HISTORY:  Adam Nunez is a 54 y.o. year old Caucasian male patient and was seen here upon referral on 01/24/2020 from Dr. Joylene Draft. Chief concern according to patient: " I am snoring, but sleep deeply- yet my sleep is not as refreshing"   I have the pleasure of seeing Adam Nunez today, a right -handed Caucasian male with a possible sleep disorder. He has a past medical history of diabetes mellitus type 2 diagnosed in 2008, allergic rhinitis, asthma, disc metabolic syndrome, GERD, hypertension, first-degree AV block, spontaneous pulmonary emboli bilaterally after a DVT in the right lower extremity followed by a pulmonary embolism in June 2010.  He had a tibial fracture in 2002 and has hardware in place. He has retrognathia, high grade soft tissue airway obstruction.     Sleep relevant medical history: Nocturia: 2-4 times, no Tonsillectomy, allergic rhinitis, deviated septum.  The left eye appears sunken - he reports this may be results of a fracture of the orbit and sinus.  Had facial numbness for a long time.   ETOH use; 1-3/month, Caffeine intake in form of Coffee( some) Soda( 1 in AM, 1 at lunch ) Tea ( daily or energy drinks!). The patient wakes up with an alarm but snoozes the alarm many times - all his life. He reports not feeling refreshed or restored in AM, with symptoms such as dry mouth, a stuffy nose, and residual fatigue.   The patient endorsed the Epworth Sleepiness Scale at 6/24 points.   The patient's weight 183 pounds with a height of 68 (inches), resulting in a BMI of 27.4 kg/m2. The patient's neck circumference measured 18 inches.  CURRENT MEDICATIONS: Lotensin, Zyrtec, Mucinex, Crestor, Combiglyze, Nasacort.   PROCEDURE:  This is a multichannel digital polysomnogram  utilizing the Somnostar 11.2 system.  Electrodes and sensors were applied and monitored per AASM Specifications.   EEG, EOG, Chin and Limb EMG, were sampled at 200 Hz.  ECG, Snore and Nasal Pressure, Thermal Airflow, Respiratory Effort, CPAP Flow and Pressure, Oximetry was sampled at 50 Hz. Digital video and audio were recorded.      BASELINE STUDY: Lights Out was at 21:58 and Lights On at 04:59.  Total recording time (TRT) was 421 minutes, with a total sleep time (TST) of 388.5 minutes.   The patient's sleep latency was 7 minutes.  REM latency was 72.5 minutes.  The sleep efficiency was 92.3 %.     SLEEP ARCHITECTURE: WASO (Wake after sleep onset) was 26 minutes.  There were 6 minutes in Stage N1, 266.5 minutes Stage N2, 36.5 minutes Stage N3 and 79.5 minutes in Stage REM.  The percentage of Stage N1 was 1.5%, Stage N2 was 68.6%, Stage N3 was 9.4% and Stage R (REM sleep) was 20.5%.   RESPIRATORY ANALYSIS:  There were a total of 65 respiratory events:  50 obstructive apneas, 4 central apneas and 0 mixed apneas with a total of 54 apneas and an apnea index (AI) of 8.3 /hour. There were 11 hypopneas with a hypopnea index of 1.7 /hour.      The total APNEA/HYPOPNEA INDEX (AHI) was 10.0/hour.  33 events occurred in REM sleep and 16 events in NREM. The REM AHI was 24.9 /hour, versus a non-REM  AHI of 6.2. The patient spent 69.5 minutes of total sleep time in the supine position and 319 minutes in non-supine.. The supine AHI was 8.6 versus a non-supine AHI of 10.4.  OXYGEN SATURATION & C02:  The Wake baseline 02 saturation was 91%, with the lowest being 88%. Time spent below 89% saturation equaled 0 minutes.  The arousals were noted as: 42 were spontaneous, 0 were associated with PLMs, 8 were associated with respiratory events. The patient had a total of 0 Periodic Limb Movements.   Audio and video analysis did not show any abnormal or unusual movements, behaviors, phonations or vocalizations.      IMPRESSION:  1. Mild, Obstructive Sleep Apnea(OSA) at AHI of 10/h with strong REM sleep dependency( AHI of 25/h). No hypoxemia associated. Snoring was evident.   RECOMMENDATIONS:  1. Advise full-night, attended, CPAP titration study to optimize therapy.  If insurance will not permit this, will go to auto CPAP titration with settings from 6-15 cm water, 2 cm EPR and interface has to be fitted ( see facial fracture history).   I certify that I have reviewed the entire raw data recording prior to the issuance of this report in accordance with the Standards of Accreditation of the American Academy of Sleep Medicine (AASM)   Larey Seat, MD Diplomat, American Board of Psychiatry and Neurology  Diplomat, American Board of Sleep Medicine Market researcher, [facility] [employer]

## 2020-03-04 NOTE — Progress Notes (Signed)
IMPRESSION:   1. Mild, Obstructive Sleep Apnea(OSA) at AHI of 10/h with strong  REM sleep dependency( AHI of 25/h). No hypoxemia associated.  Snoring was evident.   RECOMMENDATIONS:   1. Advise full-night, attended, CPAP titration study to optimize  therapy. If insurance will not permit this, will go to auto CPAP  titration with settings from 6-15 cm water, 2 cm EPR and  interface has to be fitted ( see facial fracture history).

## 2020-03-04 NOTE — Telephone Encounter (Signed)
-----   Message from Larey Seat, MD sent at 03/04/2020  8:31 AM EDT ----- IMPRESSION:   1. Mild, Obstructive Sleep Apnea(OSA) at AHI of 10/h with strong  REM sleep dependency( AHI of 25/h). No hypoxemia associated.  Snoring was evident.   RECOMMENDATIONS:   1. Advise full-night, attended, CPAP titration study to optimize  therapy. If insurance will not permit this, will go to auto CPAP  titration with settings from 6-15 cm water, 2 cm EPR and  interface has to be fitted ( see facial fracture history).

## 2020-03-04 NOTE — Telephone Encounter (Signed)
I called pt. I advised pt that Dr. Dohmeier reviewed their sleep study results and found that has mild sleep apnea and recommends that pt be treated with a cpap. Dr. Dohmeier recommends that pt return for a repeat sleep study in order to properly titrate the cpap and ensure a good mask fit. Pt is agreeable to returning for a titration study. I advised pt that our sleep lab will file with pt's insurance and call pt to schedule the sleep study when we hear back from the pt's insurance regarding coverage of this sleep study. Pt verbalized understanding of results. Pt had no questions at this time but was encouraged to call back if questions arise.   

## 2020-03-04 NOTE — Addendum Note (Signed)
Addended by: Larey Seat on: 03/04/2020 08:31 AM   Modules accepted: Orders

## 2020-03-06 DIAGNOSIS — M67962 Unspecified disorder of synovium and tendon, left lower leg: Secondary | ICD-10-CM | POA: Diagnosis not present

## 2020-03-07 ENCOUNTER — Telehealth: Payer: Self-pay

## 2020-03-07 NOTE — Telephone Encounter (Signed)
LVM for pt to call me back to schedule sleep study  

## 2020-03-11 ENCOUNTER — Telehealth: Payer: Self-pay

## 2020-03-11 NOTE — Telephone Encounter (Signed)
LVM for pt to call me back to schedule sleep study  

## 2020-03-14 ENCOUNTER — Telehealth: Payer: Self-pay

## 2020-03-14 NOTE — Telephone Encounter (Signed)
We have attempted to call the patient two times to schedule sleep study.  Patient has been unavailable at the phone numbers we have on file and has not returned our calls. If patient calls back we will schedule them for their sleep study.  

## 2020-03-29 DIAGNOSIS — H5213 Myopia, bilateral: Secondary | ICD-10-CM | POA: Diagnosis not present

## 2020-03-29 DIAGNOSIS — H524 Presbyopia: Secondary | ICD-10-CM | POA: Diagnosis not present

## 2020-05-08 DIAGNOSIS — E785 Hyperlipidemia, unspecified: Secondary | ICD-10-CM | POA: Diagnosis not present

## 2020-05-08 DIAGNOSIS — I2699 Other pulmonary embolism without acute cor pulmonale: Secondary | ICD-10-CM | POA: Diagnosis not present

## 2020-05-08 DIAGNOSIS — E1169 Type 2 diabetes mellitus with other specified complication: Secondary | ICD-10-CM | POA: Diagnosis not present

## 2020-05-08 DIAGNOSIS — I10 Essential (primary) hypertension: Secondary | ICD-10-CM | POA: Diagnosis not present

## 2020-07-16 ENCOUNTER — Ambulatory Visit
Admission: EM | Admit: 2020-07-16 | Discharge: 2020-07-16 | Disposition: A | Discharge status: Home / Self Care | Payer: 59 | Attending: Family Medicine | Admitting: Family Medicine

## 2020-07-16 ENCOUNTER — Other Ambulatory Visit: Payer: Self-pay

## 2020-07-16 ENCOUNTER — Encounter: Payer: Self-pay | Admitting: Emergency Medicine

## 2020-07-16 DIAGNOSIS — Z7689 Persons encountering health services in other specified circumstances: Secondary | ICD-10-CM | POA: Diagnosis not present

## 2020-07-16 DIAGNOSIS — H6690 Otitis media, unspecified, unspecified ear: Secondary | ICD-10-CM | POA: Diagnosis not present

## 2020-07-16 DIAGNOSIS — J029 Acute pharyngitis, unspecified: Secondary | ICD-10-CM | POA: Diagnosis not present

## 2020-07-16 LAB — POCT RAPID STREP A (OFFICE): Rapid Strep A Screen: NEGATIVE

## 2020-07-16 MED ORDER — LIDOCAINE VISCOUS HCL 2 % MT SOLN: 15.0000 mL | Freq: Three times a day (TID) | OROMUCOSAL | 0 refills | Status: DC | PRN

## 2020-07-16 MED ORDER — CETIRIZINE HCL 10 MG PO TABS: 10.0000 mg | ORAL_TABLET | Freq: Every day | ORAL | 1 refills | Status: AC

## 2020-07-16 MED ORDER — FLUTICASONE PROPIONATE 50 MCG/ACT NA SUSP: 2.0000 | Freq: Every day | NASAL | 2 refills | Status: DC

## 2020-07-16 NOTE — Discharge Instructions (Signed)
Discontinue the Nasacort and try doing the Flonase 2 sprays in each nostril daily Zyrtec daily. Vicious lidocaine as needed for severe sore throat Warm salt water to the throat.  Follow up as needed for continued or worsening symptoms

## 2020-07-16 NOTE — ED Triage Notes (Signed)
Patient c/o sore throat and nasal congestion x 2 days.   Patient endorses difficulty swallowing.   Patient denies fever and cough at home.   Patient has taken Mucinex and cough drops w/ no relief of symptoms.

## 2020-07-16 NOTE — ED Provider Notes (Signed)
Adam Nunez    CSN: [id number] Arrival date & time: 07/16/20  1503      History   Chief Complaint Chief Complaint  Patient presents with  . Sore Throat    HPI Gray Doering III is a 54 y.o. male.   54 year old male with past medical history of diabetes, DVT, hyperlipidemia, hypertension.  He presents today with approximately 2 days of sore throat, significant postnasal drip, nasal congestion.  Has been taking Mucinex and using cough drops with no relief.  Feels that the sore throat is worsening.  Denies any fever, chills, cough, chest congestion, ear pain.      Past Medical History:  Diagnosis Date  . Diabetes mellitus   . DVT (deep venous thrombosis) ([place])   . Hyperlipemia   . Hypertension     Patient Active Problem List   Diagnosis Date Noted  . Type 2 diabetes mellitus without complication, without long-term current use of insulin ([place]) 01/24/2020  . Chronic pulmonary embolism ([place]) 01/24/2020  . Retrognathia 01/24/2020  . Nocturia more than twice per night 01/24/2020  . Non-restorative sleep 01/24/2020    Past Surgical History:  Procedure Laterality Date  . TIBIA FRACTURE SURGERY         Home Medications    Prior to Admission medications   Medication Sig Start Date End Date Taking? Authorizing Provider  benazepril (LOTENSIN) 40 MG tablet Take 40 mg by mouth daily. 02/14/18  Yes [provider]  ELIQUIS 2.5 MG TABS tablet  03/15/18  Yes [provider]  rosuvastatin (CRESTOR) 20 MG tablet  03/15/18  Yes [provider]  Saxagliptin-Metformin 2.12-998 MG TB24 Take by mouth.   Yes [provider]  cetirizine (ZYRTEC) 10 MG tablet Take 1 tablet (10 mg total) by mouth daily. 07/16/20   Keath Matera, Tressia Miners A, NP  dextromethorphan-guaiFENesin (MUCINEX DM) 30-600 MG 12hr tablet Take 1 tablet by mouth 2 (two) times daily.    [provider]  fluticasone (FLONASE) 50 MCG/ACT nasal spray Place 2 sprays into both  nostrils daily. 07/16/20   Luciano Cinquemani, Tressia Miners A, NP  lidocaine (XYLOCAINE) 2 % solution Use as directed 15 mLs in the mouth or throat every 8 (eight) hours as needed for mouth pain. 07/16/20   Loura Halt A, NP  triamcinolone (NASACORT) 55 MCG/ACT AERO nasal inhaler Place 2 sprays into the nose daily. Patient not taking: No sig reported  07/16/20  [provider]    Family History Family History  Problem Relation Age of Onset  . Hypertension Mother   . Hypertension Father     Social History Social History   Tobacco Use  . Smoking status: Never Smoker  . Smokeless tobacco: Never Used  Substance Use Topics  . Alcohol use: No  . Drug use: No     Allergies   Patient has no known allergies.   Review of Systems Review of Systems   Physical Exam Triage Vital Signs ED Triage Vitals  Enc Vitals Group     BP 07/16/20 1516 (!) 147/90     Pulse Rate 07/16/20 1516 95     Resp 07/16/20 1516 15     Temp 07/16/20 1516 99 F (37.2 C)     Temp Source 07/16/20 1516 Oral     SpO2 07/16/20 1516 96 %     Weight 07/16/20 1512 172 lb (78 kg)     Height 07/16/20 1512 5\' 8"  (1.727 m)     Head Circumference --  Peak Flow --      Pain Score 07/16/20 1512 7     Pain Loc --      Pain Edu? --      Excl. in [place]? --    No data found.  Updated Vital Signs BP (!) 147/90 (BP Location: Right Arm)   Pulse 95   Temp 99 F (37.2 C) (Oral)   Resp 15   Ht 5\' 8"  (1.727 m)   Wt 172 lb (78 kg)   SpO2 96%   BMI 26.15 kg/m   Visual Acuity Right Eye Distance:   Left Eye Distance:   Bilateral Distance:    Right Eye Near:   Left Eye Near:    Bilateral Near:     Physical Exam Vitals and nursing note reviewed.  Constitutional:      Appearance: Normal appearance.  HENT:     Head: Normocephalic and atraumatic.     Right Ear: Tympanic membrane and ear canal normal.     Left Ear: Tympanic membrane and ear canal normal.     Nose: Nose normal.     Mouth/Throat:     Pharynx:  Oropharynx is clear. Posterior oropharyngeal erythema present.     Comments: PND  Eyes:     Conjunctiva/sclera: Conjunctivae normal.  Cardiovascular:     Rate and Rhythm: Normal rate and regular rhythm.  Pulmonary:     Effort: Pulmonary effort is normal.     Breath sounds: Normal breath sounds.  Musculoskeletal:        General: Normal range of motion.     Cervical back: Normal range of motion.  Skin:    General: Skin is warm and dry.  Neurological:     Mental Status: He is alert.  Psychiatric:        Mood and Affect: Mood normal.      UC Treatments / Results  Labs (all labs ordered are listed, but only abnormal results are displayed) Labs Reviewed  NOVEL CORONAVIRUS, NAA  CULTURE, GROUP A STREP [facility])  POCT RAPID STREP A (OFFICE)    EKG   Radiology No results found.  Procedures Procedures (including critical care time)  Medications Ordered in UC Medications - No data to display  Initial Impression / Assessment and Plan / UC Course  I have reviewed the triage vital signs and the nursing notes.  Pertinent labs & imaging results that were available during my care of the patient were reviewed by me and considered in my medical decision making (see chart for details).     Sore throat Most likely from postnasal drip from allergies. Strep test negative here today.  Sending for culture. Will treat with Flonase and Zyrtec. Patient requesting something stronger for his throat pain.  Will prescribe patient lidocaine to use as needed.  Instructions on how to use given. Follow up as needed for continued or worsening symptoms  Final Clinical Impressions(s) / UC Diagnoses   Final diagnoses:  Sore throat     Discharge Instructions     Discontinue the Nasacort and try doing the Flonase 2 sprays in each nostril daily Zyrtec daily. Vicious lidocaine as needed for severe sore throat Warm salt water to the throat.  Follow up as needed for continued or worsening  symptoms     ED Prescriptions    Medication Sig Dispense Auth. Provider   cetirizine (ZYRTEC) 10 MG tablet Take 1 tablet (10 mg total) by mouth daily. 30 tablet Rube Sanchez A, NP   fluticasone (FLONASE)  50 MCG/ACT nasal spray Place 2 sprays into both nostrils daily. 16 g Jarrah Seher A, NP   lidocaine (XYLOCAINE) 2 % solution Use as directed 15 mLs in the mouth or throat every 8 (eight) hours as needed for mouth pain. 100 mL Loura Halt A, NP     PDMP not reviewed this encounter.   Loura Halt A, NP 07/16/20 1600

## 2020-07-17 LAB — SARS-COV-2, NAA 2 DAY TAT

## 2020-07-17 LAB — NOVEL CORONAVIRUS, NAA: SARS-CoV-2, NAA: NOT DETECTED

## 2020-07-19 LAB — CULTURE, GROUP A STREP (THRC)

## 2020-07-22 ENCOUNTER — Other Ambulatory Visit (HOSPITAL_COMMUNITY): Payer: Self-pay | Admitting: Internal Medicine

## 2020-07-29 ENCOUNTER — Other Ambulatory Visit (HOSPITAL_COMMUNITY): Payer: Self-pay | Admitting: Internal Medicine

## 2020-10-22 ENCOUNTER — Other Ambulatory Visit (HOSPITAL_BASED_OUTPATIENT_CLINIC_OR_DEPARTMENT_OTHER): Payer: Self-pay

## 2020-10-29 ENCOUNTER — Other Ambulatory Visit (HOSPITAL_BASED_OUTPATIENT_CLINIC_OR_DEPARTMENT_OTHER): Payer: Self-pay

## 2020-11-12 ENCOUNTER — Other Ambulatory Visit (HOSPITAL_COMMUNITY): Payer: Self-pay

## 2020-11-12 MED FILL — Sitagliptin-Metformin HCl Tab ER 24HR 50-1000 MG: ORAL | 90 days supply | Qty: 180 | Fill #0 | Status: AC

## 2020-12-25 DIAGNOSIS — Z125 Encounter for screening for malignant neoplasm of prostate: Secondary | ICD-10-CM | POA: Diagnosis not present

## 2020-12-25 DIAGNOSIS — E785 Hyperlipidemia, unspecified: Secondary | ICD-10-CM | POA: Diagnosis not present

## 2020-12-25 DIAGNOSIS — E1169 Type 2 diabetes mellitus with other specified complication: Secondary | ICD-10-CM | POA: Diagnosis not present

## 2020-12-25 DIAGNOSIS — R946 Abnormal results of thyroid function studies: Secondary | ICD-10-CM | POA: Diagnosis not present

## 2021-01-01 DIAGNOSIS — E785 Hyperlipidemia, unspecified: Secondary | ICD-10-CM | POA: Diagnosis not present

## 2021-01-01 DIAGNOSIS — R946 Abnormal results of thyroid function studies: Secondary | ICD-10-CM | POA: Diagnosis not present

## 2021-01-01 DIAGNOSIS — R809 Proteinuria, unspecified: Secondary | ICD-10-CM | POA: Diagnosis not present

## 2021-01-01 DIAGNOSIS — R0683 Snoring: Secondary | ICD-10-CM | POA: Diagnosis not present

## 2021-01-01 DIAGNOSIS — R82998 Other abnormal findings in urine: Secondary | ICD-10-CM | POA: Diagnosis not present

## 2021-01-01 DIAGNOSIS — E1169 Type 2 diabetes mellitus with other specified complication: Secondary | ICD-10-CM | POA: Diagnosis not present

## 2021-01-01 DIAGNOSIS — I10 Essential (primary) hypertension: Secondary | ICD-10-CM | POA: Diagnosis not present

## 2021-01-01 DIAGNOSIS — Z1389 Encounter for screening for other disorder: Secondary | ICD-10-CM | POA: Diagnosis not present

## 2021-01-01 DIAGNOSIS — Z0000 Encounter for general adult medical examination without abnormal findings: Secondary | ICD-10-CM | POA: Diagnosis not present

## 2021-01-02 ENCOUNTER — Other Ambulatory Visit: Payer: Self-pay | Admitting: Internal Medicine

## 2021-01-02 DIAGNOSIS — E785 Hyperlipidemia, unspecified: Secondary | ICD-10-CM

## 2021-01-06 DIAGNOSIS — Z1212 Encounter for screening for malignant neoplasm of rectum: Secondary | ICD-10-CM | POA: Diagnosis not present

## 2021-02-20 ENCOUNTER — Other Ambulatory Visit (HOSPITAL_COMMUNITY): Payer: Self-pay

## 2021-02-20 MED ORDER — SYNJARDY XR 10-1000 MG PO TB24
ORAL_TABLET | ORAL | 11 refills | Status: DC
  Filled 2021-02-20: qty 60, 30d supply, fill #0
  Filled 2021-02-20: qty 30, 30d supply, fill #0

## 2021-02-20 MED FILL — Sitagliptin-Metformin HCl Tab ER 24HR 50-1000 MG: ORAL | 30 days supply | Qty: 60 | Fill #1 | Status: AC

## 2021-02-20 MED FILL — Sitagliptin Phosphate-Metformin HCl Tab ER 24HR 50-1000 MG: ORAL | 90 days supply | Qty: 180 | Fill #1 | Status: CN

## 2021-03-04 ENCOUNTER — Other Ambulatory Visit (HOSPITAL_COMMUNITY): Payer: Self-pay

## 2021-03-05 ENCOUNTER — Other Ambulatory Visit (HOSPITAL_COMMUNITY): Payer: Self-pay

## 2021-03-06 ENCOUNTER — Other Ambulatory Visit (HOSPITAL_COMMUNITY): Payer: Self-pay

## 2021-03-07 ENCOUNTER — Other Ambulatory Visit (HOSPITAL_COMMUNITY): Payer: Self-pay

## 2021-03-10 ENCOUNTER — Other Ambulatory Visit (HOSPITAL_COMMUNITY): Payer: Self-pay

## 2021-03-11 ENCOUNTER — Other Ambulatory Visit (HOSPITAL_COMMUNITY): Payer: Self-pay

## 2021-03-11 MED ORDER — ROSUVASTATIN CALCIUM 20 MG PO TABS
20.0000 mg | ORAL_TABLET | Freq: Every day | ORAL | 3 refills | Status: DC
  Filled 2021-03-11: qty 90, 90d supply, fill #0
  Filled 2021-06-04: qty 90, 90d supply, fill #1

## 2021-03-14 ENCOUNTER — Other Ambulatory Visit (HOSPITAL_COMMUNITY): Payer: Self-pay

## 2021-03-26 ENCOUNTER — Other Ambulatory Visit (HOSPITAL_COMMUNITY): Payer: Self-pay

## 2021-03-26 MED FILL — Sitagliptin-Metformin HCl Tab ER 24HR 50-1000 MG: ORAL | 30 days supply | Qty: 60 | Fill #2 | Status: AC

## 2021-03-31 DIAGNOSIS — H3554 Dystrophies primarily involving the retinal pigment epithelium: Secondary | ICD-10-CM | POA: Diagnosis not present

## 2021-03-31 DIAGNOSIS — H0102A Squamous blepharitis right eye, upper and lower eyelids: Secondary | ICD-10-CM | POA: Diagnosis not present

## 2021-03-31 DIAGNOSIS — E119 Type 2 diabetes mellitus without complications: Secondary | ICD-10-CM | POA: Diagnosis not present

## 2021-03-31 DIAGNOSIS — H524 Presbyopia: Secondary | ICD-10-CM | POA: Diagnosis not present

## 2021-03-31 DIAGNOSIS — H0102B Squamous blepharitis left eye, upper and lower eyelids: Secondary | ICD-10-CM | POA: Diagnosis not present

## 2021-03-31 DIAGNOSIS — H35033 Hypertensive retinopathy, bilateral: Secondary | ICD-10-CM | POA: Diagnosis not present

## 2021-03-31 DIAGNOSIS — H5213 Myopia, bilateral: Secondary | ICD-10-CM | POA: Diagnosis not present

## 2021-03-31 DIAGNOSIS — H11153 Pinguecula, bilateral: Secondary | ICD-10-CM | POA: Diagnosis not present

## 2021-04-03 ENCOUNTER — Other Ambulatory Visit (HOSPITAL_COMMUNITY): Payer: Self-pay

## 2021-04-03 MED ORDER — ELIQUIS 2.5 MG PO TABS
2.5000 mg | ORAL_TABLET | Freq: Two times a day (BID) | ORAL | 3 refills | Status: DC
  Filled 2021-04-03: qty 180, 90d supply, fill #0

## 2021-04-03 MED ORDER — ELIQUIS 2.5 MG PO TABS
ORAL_TABLET | ORAL | 1 refills | Status: DC
  Filled 2021-04-03: qty 180, 90d supply, fill #0
  Filled 2021-07-03: qty 180, 90d supply, fill #1

## 2021-04-16 ENCOUNTER — Other Ambulatory Visit (HOSPITAL_COMMUNITY): Payer: Self-pay

## 2021-04-16 MED FILL — Benazepril HCl Tab 40 MG: ORAL | 90 days supply | Qty: 90 | Fill #0 | Status: AC

## 2021-04-29 ENCOUNTER — Other Ambulatory Visit (HOSPITAL_COMMUNITY): Payer: Self-pay

## 2021-04-29 MED FILL — Sitagliptin-Metformin HCl Tab ER 24HR 50-1000 MG: ORAL | 30 days supply | Qty: 60 | Fill #3 | Status: AC

## 2021-05-07 ENCOUNTER — Other Ambulatory Visit: Payer: Self-pay | Admitting: Internal Medicine

## 2021-05-07 DIAGNOSIS — I10 Essential (primary) hypertension: Secondary | ICD-10-CM | POA: Diagnosis not present

## 2021-05-07 DIAGNOSIS — E785 Hyperlipidemia, unspecified: Secondary | ICD-10-CM | POA: Diagnosis not present

## 2021-05-07 DIAGNOSIS — E1169 Type 2 diabetes mellitus with other specified complication: Secondary | ICD-10-CM | POA: Diagnosis not present

## 2021-05-07 DIAGNOSIS — M7662 Achilles tendinitis, left leg: Secondary | ICD-10-CM | POA: Diagnosis not present

## 2021-05-07 DIAGNOSIS — J45909 Unspecified asthma, uncomplicated: Secondary | ICD-10-CM | POA: Diagnosis not present

## 2021-05-07 DIAGNOSIS — I2699 Other pulmonary embolism without acute cor pulmonale: Secondary | ICD-10-CM | POA: Diagnosis not present

## 2021-06-03 ENCOUNTER — Ambulatory Visit
Admission: RE | Admit: 2021-06-03 | Discharge: 2021-06-03 | Disposition: A | Discharge status: Home / Self Care | Payer: No Typology Code available for payment source | Source: Ambulatory Visit | Attending: Internal Medicine | Admitting: Internal Medicine

## 2021-06-03 DIAGNOSIS — E785 Hyperlipidemia, unspecified: Secondary | ICD-10-CM

## 2021-06-04 ENCOUNTER — Other Ambulatory Visit (HOSPITAL_COMMUNITY): Payer: Self-pay

## 2021-06-04 MED FILL — Sitagliptin-Metformin HCl Tab ER 24HR 50-1000 MG: ORAL | 30 days supply | Qty: 60 | Fill #4 | Status: AC

## 2021-06-05 ENCOUNTER — Other Ambulatory Visit (HOSPITAL_COMMUNITY): Payer: Self-pay

## 2021-06-09 ENCOUNTER — Other Ambulatory Visit (HOSPITAL_COMMUNITY): Payer: Self-pay

## 2021-06-09 MED ORDER — EZETIMIBE 10 MG PO TABS
ORAL_TABLET | ORAL | 3 refills | Status: DC
  Filled 2021-06-09: qty 90, 90d supply, fill #0

## 2021-07-03 ENCOUNTER — Other Ambulatory Visit (HOSPITAL_COMMUNITY): Payer: Self-pay

## 2021-07-03 MED FILL — Sitagliptin-Metformin HCl Tab ER 24HR 50-1000 MG: ORAL | 30 days supply | Qty: 60 | Fill #5 | Status: AC

## 2021-07-04 ENCOUNTER — Other Ambulatory Visit (HOSPITAL_COMMUNITY): Payer: Self-pay

## 2021-07-04 MED ORDER — BENAZEPRIL HCL 40 MG PO TABS
40.0000 mg | ORAL_TABLET | Freq: Every day | ORAL | 2 refills | Status: DC
  Filled 2021-07-04: qty 90, 90d supply, fill #0

## 2021-07-05 ENCOUNTER — Other Ambulatory Visit (HOSPITAL_COMMUNITY): Payer: Self-pay

## 2021-07-07 ENCOUNTER — Other Ambulatory Visit (HOSPITAL_COMMUNITY): Payer: Self-pay

## 2021-07-11 ENCOUNTER — Encounter: Payer: Self-pay | Admitting: Interventional Cardiology

## 2021-07-11 ENCOUNTER — Ambulatory Visit (INDEPENDENT_AMBULATORY_CARE_PROVIDER_SITE_OTHER): Payer: 59 | Admitting: Interventional Cardiology

## 2021-07-11 ENCOUNTER — Encounter: Payer: Self-pay | Admitting: *Deleted

## 2021-07-11 ENCOUNTER — Other Ambulatory Visit: Payer: Self-pay

## 2021-07-11 ENCOUNTER — Other Ambulatory Visit (HOSPITAL_COMMUNITY): Payer: Self-pay

## 2021-07-11 VITALS — BP 140/90 | HR 84 | Ht 68.0 in | Wt 181.4 lb

## 2021-07-11 DIAGNOSIS — E785 Hyperlipidemia, unspecified: Secondary | ICD-10-CM | POA: Diagnosis not present

## 2021-07-11 DIAGNOSIS — I2584 Coronary atherosclerosis due to calcified coronary lesion: Secondary | ICD-10-CM | POA: Diagnosis not present

## 2021-07-11 DIAGNOSIS — R072 Precordial pain: Secondary | ICD-10-CM

## 2021-07-11 DIAGNOSIS — E119 Type 2 diabetes mellitus without complications: Secondary | ICD-10-CM | POA: Diagnosis not present

## 2021-07-11 DIAGNOSIS — I2510 Atherosclerotic heart disease of native coronary artery without angina pectoris: Secondary | ICD-10-CM | POA: Diagnosis not present

## 2021-07-11 MED ORDER — ROSUVASTATIN CALCIUM 40 MG PO TABS
40.0000 mg | ORAL_TABLET | Freq: Every day | ORAL | 3 refills | Status: DC
Start: 2021-07-11 — End: 2021-11-27
  Filled 2021-07-11: qty 90, 90d supply, fill #0
  Filled 2021-10-06: qty 90, 90d supply, fill #1

## 2021-07-11 NOTE — Patient Instructions (Addendum)
Medication Instructions:  Your physician has recommended you make the following change in your medication: Increase Rosuvastatin to 40 mg by mouth daily. Hold Zetia  *If you need a refill on your cardiac medications before your next appointment, please call your pharmacy*   Lab Work: Your physician recommends that you return for lab work in: 3 months--Lipid and liver profiles.  This will be fasting. The lab opens at 7:30 AM  If you have labs (blood work) drawn today and your tests are completely normal, you will receive your results only by: [place] (if you have MyChart) OR A paper copy in the mail If you have any lab test that is abnormal or we need to change your treatment, we will call you to review the results.   Testing/Procedures: Your physician has requested that you have an exercise stress myoview. For further information please visit [email or web address]. Please follow instruction sheet, as given.    Follow-Up: At [facility], you and your health needs are our priority.  As part of our continuing mission to provide you with exceptional heart care, we have created designated Provider Care Teams.  These Care Teams include your primary Cardiologist (physician) and Advanced Practice Providers (APPs -  Physician Assistants and Nurse Practitioners) who all work together to provide you with the care you need, when you need it.  We recommend signing up for the patient portal called "MyChart".  Sign up information is provided on this After Visit Summary.  MyChart is used to connect with patients for Virtual Visits (Telemedicine).  Patients are able to view lab/test results, encounter notes, upcoming appointments, etc.  Non-urgent messages can be sent to your provider as well.   To learn more about what you can do with MyChart, go to [email or web address].    Your next appointment:   12 month(s)  The format for your next appointment:   In Person  Provider:   Larae Grooms, MD     Other Instructions  A referral has been made to dietician
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
## 2021-07-11 NOTE — Addendum Note (Signed)
Addended by: Thompson Grayer on: 07/11/2021 02:51 PM   Modules accepted: Orders

## 2021-07-14 ENCOUNTER — Telehealth (HOSPITAL_COMMUNITY): Payer: Self-pay | Admitting: *Deleted

## 2021-07-14 NOTE — Addendum Note (Signed)
Addended by: Jettie Booze on: 07/14/2021 11:38 AM   Modules accepted: Orders

## 2021-07-14 NOTE — Telephone Encounter (Signed)
Left message on voicemail per DPR in reference to upcoming appointment scheduled on 07/21/21 at 7:30 with detailed instructions given per Myocardial Perfusion Study Information Sheet for the test. LM to arrive 15 minutes early, and that it is imperative to arrive on time for appointment to keep from having the test rescheduled. If you need to cancel or reschedule your appointment, please call the office within 24 hours of your appointment. Failure to do so may result in a cancellation of your appointment, and a $50 no show fee. Phone number given for call back for any questions.

## 2021-07-21 ENCOUNTER — Other Ambulatory Visit: Payer: Self-pay

## 2021-07-21 ENCOUNTER — Ambulatory Visit (HOSPITAL_COMMUNITY): Payer: 59 | Attending: Interventional Cardiology

## 2021-07-21 DIAGNOSIS — I2510 Atherosclerotic heart disease of native coronary artery without angina pectoris: Secondary | ICD-10-CM | POA: Diagnosis not present

## 2021-07-21 DIAGNOSIS — R072 Precordial pain: Secondary | ICD-10-CM | POA: Diagnosis not present

## 2021-07-21 DIAGNOSIS — I2584 Coronary atherosclerosis due to calcified coronary lesion: Secondary | ICD-10-CM | POA: Diagnosis not present

## 2021-07-21 LAB — MYOCARDIAL PERFUSION IMAGING
Angina Index: 0
Duke Treadmill Score: 11
Estimated workload: 13.4
Exercise duration (min): 11 min
LV dias vol: 71 mL (ref 62–150)
LV sys vol: 30 mL
MPHR: 164 {beats}/min
Nuc Stress EF: 58 %
Peak HR: 162 {beats}/min
Percent HR: 98 %
Rest HR: 80 {beats}/min
Rest Nuclear Isotope Dose: 10.7 mCi
SDS: 2
SRS: 1
SSS: 3
ST Depression (mm): 0 mm
Stress Nuclear Isotope Dose: 32.4 mCi
TID: 0.82

## 2021-07-21 MED ORDER — TECHNETIUM TC 99M TETROFOSMIN IV KIT
10.7000 | PACK | Freq: Once | INTRAVENOUS | Status: AC | PRN
  Administered 2021-07-21: 07:00:00 10.7 via INTRAVENOUS
  Filled 2021-07-21: qty 11

## 2021-07-21 MED ORDER — TECHNETIUM TC 99M TETROFOSMIN IV KIT
32.4000 | PACK | Freq: Once | INTRAVENOUS | Status: AC | PRN
  Administered 2021-07-21: 09:00:00 32.4 via INTRAVENOUS
  Filled 2021-07-21: qty 33

## 2021-08-08 ENCOUNTER — Other Ambulatory Visit (HOSPITAL_COMMUNITY): Payer: Self-pay

## 2021-08-09 ENCOUNTER — Other Ambulatory Visit (HOSPITAL_COMMUNITY): Payer: Self-pay

## 2021-08-13 ENCOUNTER — Other Ambulatory Visit (HOSPITAL_COMMUNITY): Payer: Self-pay

## 2021-08-13 MED ORDER — JANUMET XR 50-1000 MG PO TB24
ORAL_TABLET | ORAL | 1 refills | Status: DC
  Filled 2021-08-13: qty 60, 30d supply, fill #0
  Filled 2021-09-11: qty 60, 30d supply, fill #1
  Filled 2021-10-06: qty 60, 30d supply, fill #2
  Filled 2021-11-18: qty 60, 30d supply, fill #3
  Filled 2021-12-25: qty 60, 30d supply, fill #4
  Filled 2022-01-20: qty 60, 30d supply, fill #5

## 2021-09-12 ENCOUNTER — Other Ambulatory Visit (HOSPITAL_COMMUNITY): Payer: Self-pay

## 2021-09-16 DIAGNOSIS — M722 Plantar fascial fibromatosis: Secondary | ICD-10-CM | POA: Diagnosis not present

## 2021-09-16 DIAGNOSIS — M79672 Pain in left foot: Secondary | ICD-10-CM | POA: Diagnosis not present

## 2021-09-23 ENCOUNTER — Encounter: Payer: 59 | Attending: Internal Medicine | Admitting: Dietician

## 2021-09-23 ENCOUNTER — Other Ambulatory Visit: Payer: Self-pay

## 2021-09-23 DIAGNOSIS — Z713 Dietary counseling and surveillance: Secondary | ICD-10-CM | POA: Insufficient documentation

## 2021-09-23 DIAGNOSIS — E785 Hyperlipidemia, unspecified: Secondary | ICD-10-CM | POA: Insufficient documentation

## 2021-09-23 DIAGNOSIS — E119 Type 2 diabetes mellitus without complications: Secondary | ICD-10-CM | POA: Insufficient documentation

## 2021-09-23 NOTE — Patient Instructions (Addendum)
Look into [employer] brand Protein Bars to keep with you in your office.  When taking a walk, increase your pace a little to get into your optimal heart rate range (110 - 142 BPM)  Use a combination of these three strategies to effectively lower your consumption of saturated fat 1) Consume a smaller portion when having animal products 2) Consume animal products less frequently 3) Find a reduced or low saturated fat version of that product.  When reading your nutrition labels, look for no more than 5% daily value (1g) of saturated fat per serving.  When eggs, only have 1 yolk at a time.  Look for "Steam in Bag" vegetables for a convenient options to increase your vegetable intake.

## 2021-09-23 NOTE — Progress Notes (Signed)
Diabetes Self-Management Education  Visit Type: First/Initial  Appt. Start Time: 0930 Appt. End Time: 3009  09/23/2021  Mr. Adam Nunez, identified by name and date of birth, is a 56 y.o. male with a diagnosis of Diabetes: Type 2.   ASSESSMENT Pt reports being diagnosed with arterial calcification. Pt states they are looking for dietary advice pertaining to their diabetes and cardiovascular issues. Pt is interested in convenient food options, states they are very busy. Pt reports a history of an extreme, no carb diet in 2016, lost ~40 lbs and got their A1c down to 5.4. Pt found this unsustainable and rebounded after about 6 months. Pt takes JanumetXR BID for their diabetes. Pt reports GI disturbances associated with Janumet, will take it after a meal and sees improvement. Pt reports previous diabetes education that included carb recognition, meal timing and consistency. Pt reports work related stress currently, states that they are in an adjustment period with changes at work. Pt reports exercising, traveling on weekends, talks to a group of friends, and eats comfort food to deal with stress. Pt states this usually happens in the evening if they are too busy and skip dinner. Pt reports drinking regular soda when stressed. Pt reports walking the treadmill for exercise currently, will paddle board in the summer.  Height 5\' 8"  (1.727 m), weight 183 lb 6.4 oz (83.2 kg). Body mass index is 27.89 kg/m.   Diabetes Self-Management Education - 09/23/21 0941       Visit Information   Visit Type First/Initial      Initial Visit   Diabetes Type Type 2    Are you currently following a meal plan? No    Are you taking your medications as prescribed? Yes    Date Diagnosed 2013      Psychosocial Assessment   Patient Belief/Attitude about Diabetes Motivated to manage diabetes    Self-care barriers None    Self-management support Doctor's office;Friends    Other persons present Patient    Patient  Concerns Nutrition/Meal planning;Weight Control    Special Needs None    Preferred Learning Style No preference indicated    Learning Readiness Change in progress    How often do you need to have someone help you when you read instructions, pamphlets, or other written materials from your doctor or pharmacy? 1 - Never    What is the last grade level you completed in school? Master's Degree      Pre-Education Assessment   Patient understands the diabetes disease and treatment process. Needs Instruction    Patient understands incorporating nutritional management into lifestyle. Needs Instruction    Patient undertands incorporating physical activity into lifestyle. Needs Instruction    Patient understands using medications safely. Needs Instruction    Patient understands monitoring blood glucose, interpreting and using results Needs Instruction    Patient understands prevention, detection, and treatment of acute complications. Needs Instruction    Patient understands prevention, detection, and treatment of chronic complications. Needs Instruction    Patient understands how to develop strategies to address psychosocial issues. Needs Instruction    Patient understands how to develop strategies to promote health/change behavior. Needs Instruction      Complications   Last HgB A1C per patient/outside source 6.6 %   October, 2022   How often do you check your blood sugar? 0 times/day (not testing)    Have you had a dilated eye exam in the past 12 months? Yes    Have you had a dental exam  in the past 12 months? Yes    Are you checking your feet? Yes    How many days per week are you checking your feet? 7      Dietary Intake   Breakfast Greek yogurt,    Snack (morning) Diet Dr. Malachi Bonds    Lunch [employer] Chicken pot pie, mashed potatoes, gravy, diet soda    Snack (afternoon) none    Dinner 2 slices of pepperoni pizza, handful of corn chips, salsa, decaf tea w/ Splenda    Snack (evening) none     Beverage(s) Water, Dier Dr. Malachi Bonds      Exercise   Exercise Type ADL's;Light (walking / raking leaves)    How many days per week to you exercise? 4    How many minutes per day do you exercise? 45    Total minutes per week of exercise 180      Patient Education   Previous Diabetes Education Yes (please comment)   10 years ago   Disease state  Explored patient's options for treatment of their diabetes    Nutrition management  Role of diet in the treatment of diabetes and the relationship between the three main macronutrients and blood glucose level;Meal options for control of blood glucose level and chronic complications.    Physical activity and exercise  Role of exercise on diabetes management, blood pressure control and cardiac health.    Medications Reviewed patients medication for diabetes, action, purpose, timing of dose and side effects.    Chronic complications Relationship between chronic complications and blood glucose control;Lipid levels, blood glucose control and heart disease   Focus on heart healthy nutrition   Psychosocial adjustment Role of stress on diabetes   Role of stress on cardiovascular health as well   Personal strategies to promote health Lifestyle issues that need to be addressed for better diabetes care      Individualized Goals (developed by patient)   Nutrition Follow meal plan discussed;General guidelines for healthy choices and portions discussed    Physical Activity Exercise 3-5 times per week    Medications take my medication as prescribed    Monitoring  Not Applicable    Reducing Risk increase portions of healthy fats      Post-Education Assessment   Patient understands the diabetes disease and treatment process. Needs Review    Patient understands incorporating nutritional management into lifestyle. Needs Review    Patient undertands incorporating physical activity into lifestyle. Needs Review    Patient understands using medications safely. Demonstrates  understanding / competency    Patient understands prevention, detection, and treatment of acute complications. Needs Review    Patient understands prevention, detection, and treatment of chronic complications. Needs Review    Patient understands how to develop strategies to address psychosocial issues. Needs Review    Patient understands how to develop strategies to promote health/change behavior. Needs Review      Outcomes   Expected Outcomes Demonstrated interest in learning. Expect positive outcomes    Future DMSE 3-4 months    Program Status Not Completed             Individualized Plan for Diabetes Self-Management Training:   Learning Objective:  Patient will have a greater understanding of diabetes self-management. Patient education plan is to attend individual and/or group sessions per assessed needs and concerns.   Plan:   Patient Instructions  Look into [place] brand Protein Bars to keep with you in your office.  When taking a walk, increase your  pace a little to get into your optimal heart rate range (110 - 142 BPM)  Use a combination of these three strategies to effectively lower your consumption of saturated fat 1) Consume a smaller portion when having animal products 2) Consume animal products less frequently 3) Find a reduced or low saturated fat version of that product.  When reading your nutrition labels, look for no more than 5% daily value (1g) of saturated fat per serving.  When eggs, only have 1 yolk at a time.  Look for "Steam in Bag" vegetables for a convenient options to increase your vegetable intake.  Expected Outcomes:  Demonstrated interest in learning. Expect positive outcomes  Education material provided: Cardiac TLC Nutrition Therapy, Stress Response   If problems or questions, patient to contact team via:  Phone and Email  Future DSME appointment: 3-4 months

## 2021-09-26 ENCOUNTER — Other Ambulatory Visit (HOSPITAL_COMMUNITY): Payer: Self-pay

## 2021-09-26 MED ORDER — BENAZEPRIL HCL 40 MG PO TABS
40.0000 mg | ORAL_TABLET | Freq: Every day | ORAL | 2 refills | Status: DC
  Filled 2021-09-26: qty 90, 90d supply, fill #0
  Filled 2022-01-06: qty 90, 90d supply, fill #1
  Filled 2022-03-30: qty 90, 90d supply, fill #2

## 2021-10-06 ENCOUNTER — Other Ambulatory Visit (HOSPITAL_COMMUNITY): Payer: Self-pay

## 2021-10-06 MED ORDER — ELIQUIS 2.5 MG PO TABS
2.5000 mg | ORAL_TABLET | Freq: Two times a day (BID) | ORAL | 1 refills | Status: DC
  Filled 2021-10-06: qty 180, 90d supply, fill #0
  Filled 2022-01-06: qty 180, 90d supply, fill #1

## 2021-10-07 DIAGNOSIS — L570 Actinic keratosis: Secondary | ICD-10-CM | POA: Diagnosis not present

## 2021-10-07 DIAGNOSIS — D492 Neoplasm of unspecified behavior of bone, soft tissue, and skin: Secondary | ICD-10-CM | POA: Diagnosis not present

## 2021-10-07 DIAGNOSIS — L821 Other seborrheic keratosis: Secondary | ICD-10-CM | POA: Diagnosis not present

## 2021-10-10 ENCOUNTER — Other Ambulatory Visit: Payer: 59 | Admitting: *Deleted

## 2021-10-10 ENCOUNTER — Other Ambulatory Visit: Payer: Self-pay

## 2021-10-10 DIAGNOSIS — E785 Hyperlipidemia, unspecified: Secondary | ICD-10-CM

## 2021-10-10 LAB — HEPATIC FUNCTION PANEL
ALT: 19 IU/L (ref 0–44)
AST: 29 IU/L (ref 0–40)
Albumin: 4.5 g/dL (ref 3.8–4.9)
Alkaline Phosphatase: 66 IU/L (ref 44–121)
Bilirubin Total: 0.4 mg/dL (ref 0.0–1.2)
Bilirubin, Direct: 0.14 mg/dL (ref 0.00–0.40)
Total Protein: 7.2 g/dL (ref 6.0–8.5)

## 2021-10-10 LAB — LIPID PANEL
Chol/HDL Ratio: 3.9 ratio (ref 0.0–5.0)
Cholesterol, Total: 109 mg/dL (ref 100–199)
HDL: 28 mg/dL — ABNORMAL LOW (ref >39)
LDL Chol Calc (NIH): 59 mg/dL (ref 0–99)
Triglycerides: 121 mg/dL (ref 0–149)
VLDL Cholesterol Cal: 22 mg/dL (ref 5–40)

## 2021-11-18 ENCOUNTER — Other Ambulatory Visit (HOSPITAL_COMMUNITY): Payer: Self-pay

## 2021-11-19 ENCOUNTER — Encounter: Payer: Self-pay | Admitting: Interventional Cardiology

## 2021-11-19 DIAGNOSIS — E785 Hyperlipidemia, unspecified: Secondary | ICD-10-CM

## 2021-11-27 ENCOUNTER — Other Ambulatory Visit (HOSPITAL_COMMUNITY): Payer: Self-pay

## 2021-11-27 MED ORDER — EZETIMIBE 10 MG PO TABS
ORAL_TABLET | ORAL | 3 refills | Status: DC
  Filled 2021-11-27: qty 90, 90d supply, fill #0
  Filled 2022-02-20: qty 90, 90d supply, fill #1
  Filled 2022-05-20: qty 90, 90d supply, fill #2
  Filled 2022-08-17: qty 90, 90d supply, fill #3

## 2021-11-27 MED ORDER — ROSUVASTATIN CALCIUM 20 MG PO TABS
20.0000 mg | ORAL_TABLET | Freq: Every day | ORAL | 3 refills | Status: DC
  Filled 2021-11-27: qty 90, 90d supply, fill #0
  Filled 2022-02-20: qty 90, 90d supply, fill #1
  Filled 2022-05-20: qty 90, 90d supply, fill #2
  Filled 2022-08-17: qty 90, 90d supply, fill #3

## 2021-11-27 NOTE — Telephone Encounter (Signed)
Jettie Booze, MD   ? ?OK to go back to rosuvstatin 20 mg daily and resume Zetia 10 mg daily.  Recheck lipids and liver in 2-3 months.    ? ?JV   ? ?

## 2021-12-23 ENCOUNTER — Ambulatory Visit: Payer: 59 | Admitting: Dietician

## 2021-12-25 ENCOUNTER — Other Ambulatory Visit (HOSPITAL_COMMUNITY): Payer: Self-pay

## 2021-12-26 ENCOUNTER — Other Ambulatory Visit (HOSPITAL_COMMUNITY): Payer: Self-pay

## 2022-01-06 ENCOUNTER — Other Ambulatory Visit (HOSPITAL_COMMUNITY): Payer: Self-pay

## 2022-01-07 DIAGNOSIS — L821 Other seborrheic keratosis: Secondary | ICD-10-CM | POA: Diagnosis not present

## 2022-01-07 DIAGNOSIS — D2372 Other benign neoplasm of skin of left lower limb, including hip: Secondary | ICD-10-CM | POA: Diagnosis not present

## 2022-01-07 DIAGNOSIS — S40862A Insect bite (nonvenomous) of left upper arm, initial encounter: Secondary | ICD-10-CM | POA: Diagnosis not present

## 2022-01-07 DIAGNOSIS — L814 Other melanin hyperpigmentation: Secondary | ICD-10-CM | POA: Diagnosis not present

## 2022-01-07 DIAGNOSIS — L570 Actinic keratosis: Secondary | ICD-10-CM | POA: Diagnosis not present

## 2022-01-20 ENCOUNTER — Other Ambulatory Visit (HOSPITAL_COMMUNITY): Payer: Self-pay

## 2022-01-30 DIAGNOSIS — R739 Hyperglycemia, unspecified: Secondary | ICD-10-CM | POA: Diagnosis not present

## 2022-01-30 DIAGNOSIS — R946 Abnormal results of thyroid function studies: Secondary | ICD-10-CM | POA: Diagnosis not present

## 2022-01-30 DIAGNOSIS — E785 Hyperlipidemia, unspecified: Secondary | ICD-10-CM | POA: Diagnosis not present

## 2022-01-30 DIAGNOSIS — I10 Essential (primary) hypertension: Secondary | ICD-10-CM | POA: Diagnosis not present

## 2022-01-30 DIAGNOSIS — R7989 Other specified abnormal findings of blood chemistry: Secondary | ICD-10-CM | POA: Diagnosis not present

## 2022-01-30 DIAGNOSIS — Z125 Encounter for screening for malignant neoplasm of prostate: Secondary | ICD-10-CM | POA: Diagnosis not present

## 2022-02-06 ENCOUNTER — Other Ambulatory Visit (HOSPITAL_COMMUNITY): Payer: Self-pay

## 2022-02-06 DIAGNOSIS — E1169 Type 2 diabetes mellitus with other specified complication: Secondary | ICD-10-CM | POA: Diagnosis not present

## 2022-02-06 DIAGNOSIS — R82998 Other abnormal findings in urine: Secondary | ICD-10-CM | POA: Diagnosis not present

## 2022-02-06 DIAGNOSIS — E785 Hyperlipidemia, unspecified: Secondary | ICD-10-CM | POA: Diagnosis not present

## 2022-02-06 DIAGNOSIS — R0683 Snoring: Secondary | ICD-10-CM | POA: Diagnosis not present

## 2022-02-06 DIAGNOSIS — Z23 Encounter for immunization: Secondary | ICD-10-CM | POA: Diagnosis not present

## 2022-02-06 DIAGNOSIS — H9319 Tinnitus, unspecified ear: Secondary | ICD-10-CM | POA: Diagnosis not present

## 2022-02-06 DIAGNOSIS — R809 Proteinuria, unspecified: Secondary | ICD-10-CM | POA: Diagnosis not present

## 2022-02-06 DIAGNOSIS — J309 Allergic rhinitis, unspecified: Secondary | ICD-10-CM | POA: Diagnosis not present

## 2022-02-06 DIAGNOSIS — I2510 Atherosclerotic heart disease of native coronary artery without angina pectoris: Secondary | ICD-10-CM | POA: Diagnosis not present

## 2022-02-06 DIAGNOSIS — I10 Essential (primary) hypertension: Secondary | ICD-10-CM | POA: Diagnosis not present

## 2022-02-06 DIAGNOSIS — Z0000 Encounter for general adult medical examination without abnormal findings: Secondary | ICD-10-CM | POA: Diagnosis not present

## 2022-02-06 MED ORDER — JARDIANCE 25 MG PO TABS
ORAL_TABLET | ORAL | 11 refills | Status: DC
  Filled 2022-02-06: qty 30, 30d supply, fill #0
  Filled 2022-03-06: qty 30, 30d supply, fill #1
  Filled 2022-04-26: qty 30, 30d supply, fill #2
  Filled 2022-05-20 – 2022-05-21 (×2): qty 30, 30d supply, fill #3
  Filled 2022-06-22: qty 30, 30d supply, fill #4
  Filled 2022-07-21: qty 30, 30d supply, fill #5
  Filled 2022-08-17: qty 30, 30d supply, fill #6
  Filled 2022-09-28: qty 30, 30d supply, fill #7
  Filled 2022-10-26: qty 30, 30d supply, fill #8
  Filled 2022-12-09: qty 30, 30d supply, fill #9
  Filled 2023-01-04: qty 30, 30d supply, fill #10

## 2022-02-10 ENCOUNTER — Other Ambulatory Visit: Payer: 59

## 2022-02-10 DIAGNOSIS — E785 Hyperlipidemia, unspecified: Secondary | ICD-10-CM | POA: Diagnosis not present

## 2022-02-11 LAB — LIPID PANEL
Chol/HDL Ratio: 4.3 ratio (ref 0.0–5.0)
Cholesterol, Total: 111 mg/dL (ref 100–199)
HDL: 26 mg/dL — ABNORMAL LOW (ref >39)
LDL Chol Calc (NIH): 56 mg/dL (ref 0–99)
Triglycerides: 174 mg/dL — ABNORMAL HIGH (ref 0–149)
VLDL Cholesterol Cal: 29 mg/dL (ref 5–40)

## 2022-02-11 LAB — HEPATIC FUNCTION PANEL
ALT: 18 IU/L (ref 0–44)
AST: 32 IU/L (ref 0–40)
Albumin: 4.8 g/dL (ref 3.8–4.9)
Alkaline Phosphatase: 86 IU/L (ref 44–121)
Bilirubin Total: 0.4 mg/dL (ref 0.0–1.2)
Bilirubin, Direct: 0.14 mg/dL (ref 0.00–0.40)
Total Protein: 7.5 g/dL (ref 6.0–8.5)

## 2022-02-20 ENCOUNTER — Other Ambulatory Visit (HOSPITAL_COMMUNITY): Payer: Self-pay

## 2022-02-20 MED ORDER — JANUMET XR 50-1000 MG PO TB24
ORAL_TABLET | ORAL | 0 refills | Status: DC
  Filled 2022-02-20 (×2): qty 60, 30d supply, fill #0
  Filled 2022-03-22: qty 60, 30d supply, fill #1
  Filled 2022-04-26: qty 60, 30d supply, fill #2

## 2022-03-06 ENCOUNTER — Other Ambulatory Visit (HOSPITAL_COMMUNITY): Payer: Self-pay

## 2022-03-23 ENCOUNTER — Other Ambulatory Visit (HOSPITAL_COMMUNITY): Payer: Self-pay

## 2022-03-30 ENCOUNTER — Other Ambulatory Visit (HOSPITAL_COMMUNITY): Payer: Self-pay

## 2022-03-30 MED ORDER — ELIQUIS 2.5 MG PO TABS
2.5000 mg | ORAL_TABLET | Freq: Two times a day (BID) | ORAL | 1 refills | Status: DC
  Filled 2022-03-30: qty 180, 90d supply, fill #0
  Filled 2022-06-22: qty 180, 90d supply, fill #1

## 2022-03-31 ENCOUNTER — Other Ambulatory Visit (HOSPITAL_COMMUNITY): Payer: Self-pay

## 2022-04-01 ENCOUNTER — Other Ambulatory Visit (HOSPITAL_COMMUNITY): Payer: Self-pay

## 2022-04-01 DIAGNOSIS — H3554 Dystrophies primarily involving the retinal pigment epithelium: Secondary | ICD-10-CM | POA: Diagnosis not present

## 2022-04-01 DIAGNOSIS — E119 Type 2 diabetes mellitus without complications: Secondary | ICD-10-CM | POA: Diagnosis not present

## 2022-04-01 DIAGNOSIS — H0102A Squamous blepharitis right eye, upper and lower eyelids: Secondary | ICD-10-CM | POA: Diagnosis not present

## 2022-04-01 DIAGNOSIS — H35033 Hypertensive retinopathy, bilateral: Secondary | ICD-10-CM | POA: Diagnosis not present

## 2022-04-01 DIAGNOSIS — H2513 Age-related nuclear cataract, bilateral: Secondary | ICD-10-CM | POA: Diagnosis not present

## 2022-04-01 DIAGNOSIS — H0102B Squamous blepharitis left eye, upper and lower eyelids: Secondary | ICD-10-CM | POA: Diagnosis not present

## 2022-04-27 ENCOUNTER — Other Ambulatory Visit (HOSPITAL_COMMUNITY): Payer: Self-pay

## 2022-05-15 DIAGNOSIS — G4733 Obstructive sleep apnea (adult) (pediatric): Secondary | ICD-10-CM | POA: Diagnosis not present

## 2022-05-15 DIAGNOSIS — E1169 Type 2 diabetes mellitus with other specified complication: Secondary | ICD-10-CM | POA: Diagnosis not present

## 2022-05-15 DIAGNOSIS — I10 Essential (primary) hypertension: Secondary | ICD-10-CM | POA: Diagnosis not present

## 2022-05-15 DIAGNOSIS — U099 Post covid-19 condition, unspecified: Secondary | ICD-10-CM | POA: Diagnosis not present

## 2022-05-20 ENCOUNTER — Other Ambulatory Visit (HOSPITAL_COMMUNITY): Payer: Self-pay

## 2022-05-20 MED ORDER — JANUMET XR 50-1000 MG PO TB24
2.0000 | ORAL_TABLET | Freq: Every evening | ORAL | 3 refills | Status: DC
  Filled 2022-05-20: qty 60, 30d supply, fill #0
  Filled 2022-06-22: qty 60, 30d supply, fill #1
  Filled 2022-07-21: qty 60, 30d supply, fill #2
  Filled 2022-08-17: qty 60, 30d supply, fill #3
  Filled 2022-09-28: qty 60, 30d supply, fill #4
  Filled 2022-10-26: qty 60, 30d supply, fill #5
  Filled 2022-12-09: qty 60, 30d supply, fill #6
  Filled 2023-01-04: qty 60, 30d supply, fill #7
  Filled 2023-02-08: qty 60, 30d supply, fill #8
  Filled 2023-03-05: qty 60, 30d supply, fill #9
  Filled 2023-04-05: qty 60, 30d supply, fill #10
  Filled 2023-05-05: qty 60, 30d supply, fill #11

## 2022-05-21 ENCOUNTER — Other Ambulatory Visit (HOSPITAL_COMMUNITY): Payer: Self-pay

## 2022-05-22 ENCOUNTER — Other Ambulatory Visit (HOSPITAL_COMMUNITY): Payer: Self-pay

## 2022-06-22 ENCOUNTER — Other Ambulatory Visit (HOSPITAL_COMMUNITY): Payer: Self-pay

## 2022-06-29 ENCOUNTER — Other Ambulatory Visit (HOSPITAL_COMMUNITY): Payer: Self-pay

## 2022-06-30 ENCOUNTER — Other Ambulatory Visit (HOSPITAL_COMMUNITY): Payer: Self-pay

## 2022-06-30 MED ORDER — BENAZEPRIL HCL 40 MG PO TABS
40.0000 mg | ORAL_TABLET | Freq: Every day | ORAL | 3 refills | Status: DC
  Filled 2022-06-30: qty 90, 90d supply, fill #0
  Filled 2022-09-28: qty 90, 90d supply, fill #1
  Filled 2022-12-23: qty 90, 90d supply, fill #2
  Filled 2023-04-05: qty 90, 90d supply, fill #3

## 2022-07-21 ENCOUNTER — Other Ambulatory Visit: Payer: Self-pay

## 2022-07-30 ENCOUNTER — Other Ambulatory Visit (HOSPITAL_COMMUNITY): Payer: Self-pay

## 2022-08-17 ENCOUNTER — Other Ambulatory Visit (HOSPITAL_COMMUNITY): Payer: Self-pay

## 2022-08-27 DIAGNOSIS — D485 Neoplasm of uncertain behavior of skin: Secondary | ICD-10-CM | POA: Diagnosis not present

## 2022-08-27 DIAGNOSIS — L578 Other skin changes due to chronic exposure to nonionizing radiation: Secondary | ICD-10-CM | POA: Diagnosis not present

## 2022-08-27 DIAGNOSIS — L570 Actinic keratosis: Secondary | ICD-10-CM | POA: Diagnosis not present

## 2022-09-15 DIAGNOSIS — J309 Allergic rhinitis, unspecified: Secondary | ICD-10-CM | POA: Diagnosis not present

## 2022-09-15 DIAGNOSIS — H9319 Tinnitus, unspecified ear: Secondary | ICD-10-CM | POA: Diagnosis not present

## 2022-09-15 DIAGNOSIS — I2510 Atherosclerotic heart disease of native coronary artery without angina pectoris: Secondary | ICD-10-CM | POA: Diagnosis not present

## 2022-09-15 DIAGNOSIS — R946 Abnormal results of thyroid function studies: Secondary | ICD-10-CM | POA: Diagnosis not present

## 2022-09-15 DIAGNOSIS — E785 Hyperlipidemia, unspecified: Secondary | ICD-10-CM | POA: Diagnosis not present

## 2022-09-15 DIAGNOSIS — J45909 Unspecified asthma, uncomplicated: Secondary | ICD-10-CM | POA: Diagnosis not present

## 2022-09-15 DIAGNOSIS — E1169 Type 2 diabetes mellitus with other specified complication: Secondary | ICD-10-CM | POA: Diagnosis not present

## 2022-09-15 DIAGNOSIS — I10 Essential (primary) hypertension: Secondary | ICD-10-CM | POA: Diagnosis not present

## 2022-09-15 DIAGNOSIS — G4733 Obstructive sleep apnea (adult) (pediatric): Secondary | ICD-10-CM | POA: Diagnosis not present

## 2022-09-15 DIAGNOSIS — R809 Proteinuria, unspecified: Secondary | ICD-10-CM | POA: Diagnosis not present

## 2022-09-28 ENCOUNTER — Other Ambulatory Visit (HOSPITAL_COMMUNITY): Payer: Self-pay

## 2022-09-29 ENCOUNTER — Other Ambulatory Visit (HOSPITAL_COMMUNITY): Payer: Self-pay

## 2022-09-29 ENCOUNTER — Other Ambulatory Visit: Payer: Self-pay

## 2022-09-29 ENCOUNTER — Other Ambulatory Visit (HOSPITAL_BASED_OUTPATIENT_CLINIC_OR_DEPARTMENT_OTHER): Payer: Self-pay

## 2022-09-29 MED ORDER — ELIQUIS 2.5 MG PO TABS
2.5000 mg | ORAL_TABLET | Freq: Two times a day (BID) | ORAL | 1 refills | Status: DC
  Filled 2022-09-29 (×2): qty 180, 90d supply, fill #0
  Filled 2022-12-23: qty 180, 90d supply, fill #1

## 2022-09-30 ENCOUNTER — Other Ambulatory Visit (HOSPITAL_COMMUNITY): Payer: Self-pay

## 2022-10-26 ENCOUNTER — Other Ambulatory Visit (HOSPITAL_COMMUNITY): Payer: Self-pay

## 2022-11-10 ENCOUNTER — Other Ambulatory Visit: Payer: Self-pay | Admitting: Interventional Cardiology

## 2022-11-11 MED ORDER — ROSUVASTATIN CALCIUM 20 MG PO TABS
20.0000 mg | ORAL_TABLET | Freq: Every day | ORAL | 0 refills | Status: DC
  Filled 2022-11-11: qty 30, 30d supply, fill #0

## 2022-11-11 MED ORDER — EZETIMIBE 10 MG PO TABS
10.0000 mg | ORAL_TABLET | Freq: Every day | ORAL | 0 refills | Status: DC
  Filled 2022-11-11: qty 30, 30d supply, fill #0

## 2022-11-12 ENCOUNTER — Other Ambulatory Visit: Payer: Self-pay

## 2022-11-12 ENCOUNTER — Other Ambulatory Visit (HOSPITAL_COMMUNITY): Payer: Self-pay

## 2022-12-05 ENCOUNTER — Emergency Department (HOSPITAL_BASED_OUTPATIENT_CLINIC_OR_DEPARTMENT_OTHER)
Admission: EM | Admit: 2022-12-05 | Discharge: 2022-12-05 | Disposition: A | Discharge status: Home / Self Care | Payer: 59 | Attending: Emergency Medicine | Admitting: Emergency Medicine

## 2022-12-05 ENCOUNTER — Encounter (HOSPITAL_BASED_OUTPATIENT_CLINIC_OR_DEPARTMENT_OTHER): Payer: Self-pay | Admitting: Emergency Medicine

## 2022-12-05 ENCOUNTER — Other Ambulatory Visit: Payer: Self-pay

## 2022-12-05 ENCOUNTER — Emergency Department (HOSPITAL_BASED_OUTPATIENT_CLINIC_OR_DEPARTMENT_OTHER): Payer: 59

## 2022-12-05 DIAGNOSIS — S42035A Nondisplaced fracture of lateral end of left clavicle, initial encounter for closed fracture: Secondary | ICD-10-CM | POA: Insufficient documentation

## 2022-12-05 DIAGNOSIS — Y9301 Activity, walking, marching and hiking: Secondary | ICD-10-CM | POA: Insufficient documentation

## 2022-12-05 DIAGNOSIS — E119 Type 2 diabetes mellitus without complications: Secondary | ICD-10-CM | POA: Insufficient documentation

## 2022-12-05 DIAGNOSIS — Z79899 Other long term (current) drug therapy: Secondary | ICD-10-CM | POA: Diagnosis not present

## 2022-12-05 DIAGNOSIS — I10 Essential (primary) hypertension: Secondary | ICD-10-CM | POA: Insufficient documentation

## 2022-12-05 DIAGNOSIS — Z7901 Long term (current) use of anticoagulants: Secondary | ICD-10-CM | POA: Diagnosis not present

## 2022-12-05 DIAGNOSIS — S4992XA Unspecified injury of left shoulder and upper arm, initial encounter: Secondary | ICD-10-CM | POA: Diagnosis present

## 2022-12-05 DIAGNOSIS — W228XXA Striking against or struck by other objects, initial encounter: Secondary | ICD-10-CM | POA: Diagnosis not present

## 2022-12-05 DIAGNOSIS — Z7984 Long term (current) use of oral hypoglycemic drugs: Secondary | ICD-10-CM | POA: Diagnosis not present

## 2022-12-05 DIAGNOSIS — S42032A Displaced fracture of lateral end of left clavicle, initial encounter for closed fracture: Secondary | ICD-10-CM | POA: Diagnosis not present

## 2022-12-05 MED ORDER — OXYCODONE HCL 5 MG PO TABS: 5.0000 mg | ORAL_TABLET | Freq: Four times a day (QID) | ORAL | 0 refills | Status: DC | PRN

## 2022-12-05 MED ORDER — HYDROCODONE-ACETAMINOPHEN 5-325 MG PO TABS
1.0000 | ORAL_TABLET | Freq: Once | ORAL | Status: AC
  Administered 2022-12-05: 1 via ORAL
  Filled 2022-12-05: qty 1

## 2022-12-05 NOTE — ED Triage Notes (Signed)
Pt reports he fell into a door and has pain on top of L shoulder. Pain worst when reaching forward. Pt able to raise arm out to side.

## 2022-12-05 NOTE — Discharge Instructions (Addendum)
You have been seen today for your complaint of left shoulder pain. Your imaging showed a fracture of the collarbone and. Your discharge medications include oxycodone. This is an opioid pain medication. You should only take this medication as needed for severe pain. You should not drive, operate heavy machinery or make important decisions while taking this medication. You should use alternative methods for pain relief while taking this medication including stretching, gentle range of motion, and tylenol.  You may take up to 1000 mg of Tylenol every 6 hours for pain Home care instructions are as follows:  Wear the shoulder sling until you are able to follow-up with orthopedics Follow up with: Dr. Shon Baton.  He is an Investment banker, operational.  Call on Monday to schedule an ED follow-up visit regarding your fractured collarbone Please seek immediate medical care if you develop any of the following symptoms: Your arm on the injured side is numb, cold, or pale. At this time there does not appear to be the presence of an emergent medical condition, however there is always the potential for conditions to change. Please read and follow the below instructions.  Do not take your medicine if  develop an itchy rash, swelling in your mouth or lips, or difficulty breathing; call 911 and seek immediate emergency medical attention if this occurs.  You may review your lab tests and imaging results in their entirety on your MyChart account.  Please discuss all results of fully with your primary care provider and other specialist at your follow-up visit.  Note: Portions of this text may have been transcribed using voice recognition software. Every effort was made to ensure accuracy; however, inadvertent computerized transcription errors may still be present.

## 2022-12-05 NOTE — ED Provider Notes (Signed)
[place] [facility] Provider Note   CSN: [id number] Arrival date & time: 12/05/22  1641     History  Chief Complaint  Patient presents with   Arm Injury    Adam Nunez is a 57 y.o. male with history of hypertension, diabetes, hyperlipidemia who presents to the ED for evaluation of left shoulder pain.  Approximately 5 hours prior to arrival he was walking a dog when the dog pulled him forward.  He hit the anterior of his left shoulder on a door frame and felt a pop.  He noticed pain immediately afterwards.  It occasionally shoots down to the elbow on the left side.  He denies numbness, weakness or tingling.  He took a tramadol shortly after the incident but states it has not improved his pain very much.  The pain is worse with forward flexion of the shoulder.   Arm Injury      Home Medications Prior to Admission medications   Medication Sig Start Date End Date Taking? Authorizing Provider  oxyCODONE (ROXICODONE) 5 MG immediate release tablet Take 1 tablet (5 mg total) by mouth every 6 (six) hours as needed for severe pain. 12/05/22  Yes Delford Wingert, Edsel Petrin, PA-C  apixaban (ELIQUIS) 2.5 MG TABS tablet Take 1 tablet (2.5 mg total) by mouth 2 (two) times daily. 09/28/22     benazepril (LOTENSIN) 40 MG tablet Take 40 mg by mouth daily. 02/14/18   [provider]  benazepril (LOTENSIN) 40 MG tablet TAKE 1 TABLET BY MOUTH DAILY 06/29/22     cetirizine (ZYRTEC) 10 MG tablet Take 1 tablet (10 mg total) by mouth daily. 07/16/20   Dahlia Byes A, NP  empagliflozin (JARDIANCE) 25 MG TABS tablet Take 1 tablet by mouth daily 02/06/22     ezetimibe (ZETIA) 10 MG tablet Take 1 tablet (10 mg total) by mouth daily. Must schedule appointment for refills 11/11/22   Corky Crafts, MD  fluticasone [facility]) 50 MCG/ACT nasal spray Place 2 sprays into both nostrils daily. 07/16/20   Dahlia Byes A, NP  rosuvastatin (CRESTOR) 20 MG tablet Take 1 tablet  (20 mg total) by mouth daily. Must schedule appointment for refills 11/11/22   Corky Crafts, MD  Saxagliptin-Metformin 2.12-998 MG TB24 Take by mouth.    [provider]  Saxagliptin-Metformin 2.12-998 MG TB24 TAKE 2 TABLETS BY MOUTH DAILY WITH FOOD 11/13/19 11/12/20  Rodrigo Ran, MD  SitaGLIPtin-MetFORMIN HCl (JANUMET XR) 50-1000 MG TB24 Take 2 tablets by mouth every evening with meal 05/20/22     SitaGLIPtin-MetFORMIN HCl 50-1000 MG TB24 TAKE 2 TABLETS BY MOUTH WITH EVENING MEAL ONCE A DAY. Patient not taking: Reported on 07/11/2021 07/22/20 07/22/21  Rodrigo Ran, MD  triamcinolone (NASACORT) 55 MCG/ACT AERO nasal inhaler Place 2 sprays into the nose daily. Patient not taking: No sig reported  07/16/20  [provider]      Allergies    Patient has no known allergies.    Review of Systems   Review of Systems  Musculoskeletal:  Positive for arthralgias.  All other systems reviewed and are negative.   Physical Exam Updated Vital Signs BP (!) 151/83 (BP Location: Right Arm)   Pulse 78   Temp 98.1 F (36.7 C) (Oral)   Resp 18   Ht 5\' 8"  (1.727 m)   Wt 79.4 kg   SpO2 95%   BMI 26.61 kg/m  Physical Exam Vitals and nursing note reviewed.  Constitutional:  General: He is not in acute distress.    Appearance: Normal appearance. He is normal weight. He is not ill-appearing.  HENT:     Head: Normocephalic and atraumatic.  Pulmonary:     Effort: Pulmonary effort is normal. No respiratory distress.  Abdominal:     General: Abdomen is flat.  Musculoskeletal:     Cervical back: Neck supple.     Comments: TTP at the [facility] joint of the left shoulder without obvious deformity.  Full active ROM of abduction and adduction.  Slightly decreased active ROM of forward flexion secondary to pain.  Full passive ROM.  Sensation intact in all digits.  Capillary refill less than 2.  Radial pulse 2+.  Grip strength 5 out of 5  Skin:    General: Skin is warm and dry.   Neurological:     Mental Status: He is alert and oriented to person, place, and time.  Psychiatric:        Mood and Affect: Mood normal.        Behavior: Behavior normal.     ED Results / Procedures / Treatments   Labs (all labs ordered are listed, but only abnormal results are displayed) Labs Reviewed - No data to display  EKG None  Radiology DG Shoulder Left  Result Date: 12/05/2022 CLINICAL DATA:  Left shoulder pain worse when raising arm EXAM: LEFT SHOULDER - 2+ VIEW COMPARISON:  None Available. FINDINGS: Acute comminuted minimally displaced fracture of the distal left clavicle. No glenohumeral or AC joint dislocation. IMPRESSION: Acute comminuted minimally displaced fracture of the distal left clavicle. Electronically Signed   By: Minerva Fester M.D.   On: 12/05/2022 17:22    Procedures Procedures    Medications Ordered in ED Medications  HYDROcodone-acetaminophen (NORCO/VICODIN) 5-325 MG per tablet 1 tablet (1 tablet Oral Given 12/05/22 1729)    ED Course/ Medical Decision Making/ A&P Clinical Course as of 12/05/22 1746  Sat Dec 05, 2022  1715 DG Shoulder Left I personally reviewed and interpreted the image.  There appears to be a distal clavicle fracture. [AS]    Clinical Course User Index [AS] Inigo Lantigua, Edsel Petrin, PA-C                             Medical Decision Making Amount and/or Complexity of Data Reviewed Radiology: ordered.  Risk Prescription drug management.  This patient presents to the ED for concern of left shoulder pain, this involves an extensive number of treatment options, and is a complaint that carries with it a high risk of complications and morbidity.  The differential diagnosis includes fracture, strain, sprain, contusion, dislocation  Co morbidities that complicate the patient evaluation   hypertension, diabetes, hyperlipidemia  My initial workup includes pain control, imaging  Additional history obtained from: Nursing notes from  this visit.  I ordered imaging studies including x-ray left shoulder I independently visualized and interpreted imaging which showed comminuted, minimally displaced distal clavicle fracture I agree with the radiologist interpretation  Afebrile, hemodynamically stable.  57 year old male presents ED for evaluation of left shoulder pain.  He hit the anterior of his left shoulder on the door frame.  There are no obvious deformities.  There is some tenderness over the distal clavicle.  No overlying lesions or wounds.  X-ray shows comminuted, minimally displaced fracture of the distal clavicle.  His neurovascular status is intact.  He was placed in a sling while in the ED.  He is  sent a prescription for oxycodone and educated on appropriate use and potential side effects.  He was also given contact information for orthopedics and encouraged to follow-up.  At this time there does not appear to be any evidence of an acute emergency medical condition and the patient appears stable for discharge with appropriate outpatient follow up. Diagnosis was discussed with patient who verbalizes understanding of care plan and is agreeable to discharge. I have discussed return precautions with patient who verbalizes understanding. Patient encouraged to follow-up with orthopedics within 1 week. All questions answered.  Patient's case discussed with Dr. Silverio Lay who agrees with plan to discharge with follow-up.   Note: Portions of this report may have been transcribed using voice recognition software. Every effort was made to ensure accuracy; however, inadvertent computerized transcription errors may still be present.        Final Clinical Impression(s) / ED Diagnoses Final diagnoses:  Closed nondisplaced fracture of acromial end of left clavicle, initial encounter    Rx / DC Orders ED Discharge Orders          Ordered    oxyCODONE (ROXICODONE) 5 MG immediate release tablet  Every 6 hours PRN        12/05/22 1746               Michelle Piper, PA-C 12/05/22 1746    Charlynne Pander, MD 12/05/22 2051

## 2022-12-07 DIAGNOSIS — S42002D Fracture of unspecified part of left clavicle, subsequent encounter for fracture with routine healing: Secondary | ICD-10-CM | POA: Diagnosis not present

## 2022-12-09 ENCOUNTER — Other Ambulatory Visit: Payer: Self-pay | Admitting: Interventional Cardiology

## 2022-12-09 ENCOUNTER — Other Ambulatory Visit (HOSPITAL_COMMUNITY): Payer: Self-pay

## 2022-12-09 ENCOUNTER — Other Ambulatory Visit: Payer: Self-pay

## 2022-12-09 MED ORDER — EZETIMIBE 10 MG PO TABS
10.0000 mg | ORAL_TABLET | Freq: Every day | ORAL | 0 refills | Status: DC
  Filled 2022-12-09: qty 30, 30d supply, fill #0

## 2022-12-21 DIAGNOSIS — M25512 Pain in left shoulder: Secondary | ICD-10-CM | POA: Diagnosis not present

## 2022-12-23 ENCOUNTER — Other Ambulatory Visit: Payer: Self-pay | Admitting: Interventional Cardiology

## 2022-12-23 ENCOUNTER — Other Ambulatory Visit (HOSPITAL_COMMUNITY): Payer: Self-pay

## 2022-12-23 MED ORDER — ROSUVASTATIN CALCIUM 20 MG PO TABS
20.0000 mg | ORAL_TABLET | Freq: Every day | ORAL | 0 refills | Status: DC
  Filled 2022-12-23: qty 30, 30d supply, fill #0

## 2023-01-04 ENCOUNTER — Other Ambulatory Visit: Payer: Self-pay

## 2023-01-04 ENCOUNTER — Other Ambulatory Visit: Payer: Self-pay | Admitting: Interventional Cardiology

## 2023-01-04 ENCOUNTER — Other Ambulatory Visit (HOSPITAL_COMMUNITY): Payer: Self-pay

## 2023-01-05 ENCOUNTER — Other Ambulatory Visit (HOSPITAL_COMMUNITY): Payer: Self-pay

## 2023-01-05 MED ORDER — EZETIMIBE 10 MG PO TABS
10.0000 mg | ORAL_TABLET | Freq: Every day | ORAL | 0 refills | Status: DC
  Filled 2023-01-05: qty 15, 15d supply, fill #0

## 2023-01-07 DIAGNOSIS — M25512 Pain in left shoulder: Secondary | ICD-10-CM | POA: Diagnosis not present

## 2023-01-25 DIAGNOSIS — L570 Actinic keratosis: Secondary | ICD-10-CM | POA: Diagnosis not present

## 2023-01-25 DIAGNOSIS — D225 Melanocytic nevi of trunk: Secondary | ICD-10-CM | POA: Diagnosis not present

## 2023-01-25 DIAGNOSIS — L821 Other seborrheic keratosis: Secondary | ICD-10-CM | POA: Diagnosis not present

## 2023-01-25 DIAGNOSIS — D492 Neoplasm of unspecified behavior of bone, soft tissue, and skin: Secondary | ICD-10-CM | POA: Diagnosis not present

## 2023-01-25 DIAGNOSIS — L814 Other melanin hyperpigmentation: Secondary | ICD-10-CM | POA: Diagnosis not present

## 2023-02-08 ENCOUNTER — Other Ambulatory Visit: Payer: Self-pay

## 2023-02-08 ENCOUNTER — Other Ambulatory Visit: Payer: Self-pay | Admitting: Interventional Cardiology

## 2023-02-08 ENCOUNTER — Other Ambulatory Visit (HOSPITAL_COMMUNITY): Payer: Self-pay

## 2023-02-09 ENCOUNTER — Other Ambulatory Visit (HOSPITAL_COMMUNITY): Payer: Self-pay

## 2023-02-09 DIAGNOSIS — L578 Other skin changes due to chronic exposure to nonionizing radiation: Secondary | ICD-10-CM | POA: Diagnosis not present

## 2023-02-09 MED ORDER — JARDIANCE 25 MG PO TABS
25.0000 mg | ORAL_TABLET | Freq: Every day | ORAL | 11 refills | Status: DC
  Filled 2023-02-09: qty 30, 30d supply, fill #0
  Filled 2023-03-05: qty 30, 30d supply, fill #1
  Filled 2023-04-05: qty 30, 30d supply, fill #2
  Filled 2023-05-05: qty 30, 30d supply, fill #3
  Filled 2023-06-01: qty 30, 30d supply, fill #4
  Filled 2023-06-30: qty 30, 30d supply, fill #5
  Filled 2023-07-30: qty 30, 30d supply, fill #6
  Filled 2023-08-26: qty 30, 30d supply, fill #7
  Filled 2023-09-25: qty 30, 30d supply, fill #8
  Filled 2023-10-24: qty 30, 30d supply, fill #9
  Filled 2023-11-22: qty 30, 30d supply, fill #10
  Filled 2023-12-27: qty 30, 30d supply, fill #11

## 2023-02-13 ENCOUNTER — Other Ambulatory Visit (HOSPITAL_COMMUNITY): Payer: Self-pay

## 2023-03-05 ENCOUNTER — Other Ambulatory Visit (HOSPITAL_COMMUNITY): Payer: Self-pay

## 2023-03-05 ENCOUNTER — Other Ambulatory Visit: Payer: Self-pay

## 2023-03-05 ENCOUNTER — Other Ambulatory Visit: Payer: Self-pay | Admitting: Interventional Cardiology

## 2023-03-05 MED ORDER — ROSUVASTATIN CALCIUM 20 MG PO TABS
20.0000 mg | ORAL_TABLET | Freq: Every day | ORAL | 0 refills | Status: DC
  Filled 2023-03-05: qty 15, 15d supply, fill #0

## 2023-03-05 MED ORDER — EZETIMIBE 10 MG PO TABS
10.0000 mg | ORAL_TABLET | Freq: Every day | ORAL | 0 refills | Status: DC
  Filled 2023-03-05: qty 15, 15d supply, fill #0

## 2023-03-16 DIAGNOSIS — Z125 Encounter for screening for malignant neoplasm of prostate: Secondary | ICD-10-CM | POA: Diagnosis not present

## 2023-03-16 DIAGNOSIS — I10 Essential (primary) hypertension: Secondary | ICD-10-CM | POA: Diagnosis not present

## 2023-03-16 DIAGNOSIS — E785 Hyperlipidemia, unspecified: Secondary | ICD-10-CM | POA: Diagnosis not present

## 2023-03-16 DIAGNOSIS — E1169 Type 2 diabetes mellitus with other specified complication: Secondary | ICD-10-CM | POA: Diagnosis not present

## 2023-03-16 DIAGNOSIS — I2510 Atherosclerotic heart disease of native coronary artery without angina pectoris: Secondary | ICD-10-CM | POA: Diagnosis not present

## 2023-03-16 DIAGNOSIS — R946 Abnormal results of thyroid function studies: Secondary | ICD-10-CM | POA: Diagnosis not present

## 2023-03-23 DIAGNOSIS — I459 Conduction disorder, unspecified: Secondary | ICD-10-CM | POA: Diagnosis not present

## 2023-03-23 DIAGNOSIS — Z0000 Encounter for general adult medical examination without abnormal findings: Secondary | ICD-10-CM | POA: Diagnosis not present

## 2023-03-23 DIAGNOSIS — I2510 Atherosclerotic heart disease of native coronary artery without angina pectoris: Secondary | ICD-10-CM | POA: Diagnosis not present

## 2023-03-23 DIAGNOSIS — R82998 Other abnormal findings in urine: Secondary | ICD-10-CM | POA: Diagnosis not present

## 2023-03-23 DIAGNOSIS — H9319 Tinnitus, unspecified ear: Secondary | ICD-10-CM | POA: Diagnosis not present

## 2023-03-23 DIAGNOSIS — E1169 Type 2 diabetes mellitus with other specified complication: Secondary | ICD-10-CM | POA: Diagnosis not present

## 2023-03-23 DIAGNOSIS — R946 Abnormal results of thyroid function studies: Secondary | ICD-10-CM | POA: Diagnosis not present

## 2023-03-23 DIAGNOSIS — L578 Other skin changes due to chronic exposure to nonionizing radiation: Secondary | ICD-10-CM | POA: Diagnosis not present

## 2023-03-23 DIAGNOSIS — E785 Hyperlipidemia, unspecified: Secondary | ICD-10-CM | POA: Diagnosis not present

## 2023-03-23 DIAGNOSIS — I10 Essential (primary) hypertension: Secondary | ICD-10-CM | POA: Diagnosis not present

## 2023-03-23 DIAGNOSIS — I2699 Other pulmonary embolism without acute cor pulmonale: Secondary | ICD-10-CM | POA: Diagnosis not present

## 2023-03-23 DIAGNOSIS — J309 Allergic rhinitis, unspecified: Secondary | ICD-10-CM | POA: Diagnosis not present

## 2023-04-05 ENCOUNTER — Other Ambulatory Visit: Payer: Self-pay | Admitting: Interventional Cardiology

## 2023-04-05 ENCOUNTER — Other Ambulatory Visit (HOSPITAL_COMMUNITY): Payer: Self-pay

## 2023-04-06 ENCOUNTER — Other Ambulatory Visit: Payer: Self-pay

## 2023-04-06 ENCOUNTER — Other Ambulatory Visit (HOSPITAL_COMMUNITY): Payer: Self-pay

## 2023-04-06 DIAGNOSIS — H0102A Squamous blepharitis right eye, upper and lower eyelids: Secondary | ICD-10-CM | POA: Diagnosis not present

## 2023-04-06 DIAGNOSIS — E119 Type 2 diabetes mellitus without complications: Secondary | ICD-10-CM | POA: Diagnosis not present

## 2023-04-06 DIAGNOSIS — H524 Presbyopia: Secondary | ICD-10-CM | POA: Diagnosis not present

## 2023-04-06 DIAGNOSIS — H2513 Age-related nuclear cataract, bilateral: Secondary | ICD-10-CM | POA: Diagnosis not present

## 2023-04-06 DIAGNOSIS — H0102B Squamous blepharitis left eye, upper and lower eyelids: Secondary | ICD-10-CM | POA: Diagnosis not present

## 2023-04-06 DIAGNOSIS — H3554 Dystrophies primarily involving the retinal pigment epithelium: Secondary | ICD-10-CM | POA: Diagnosis not present

## 2023-04-06 DIAGNOSIS — H35033 Hypertensive retinopathy, bilateral: Secondary | ICD-10-CM | POA: Diagnosis not present

## 2023-04-06 MED ORDER — ELIQUIS 2.5 MG PO TABS
2.5000 mg | ORAL_TABLET | Freq: Two times a day (BID) | ORAL | 1 refills | Status: DC
  Filled 2023-04-06 – 2023-05-17 (×3): qty 180, 90d supply, fill #0
  Filled 2023-08-13: qty 180, 90d supply, fill #1

## 2023-04-09 ENCOUNTER — Other Ambulatory Visit: Payer: Self-pay

## 2023-04-13 DIAGNOSIS — Z1211 Encounter for screening for malignant neoplasm of colon: Secondary | ICD-10-CM | POA: Diagnosis not present

## 2023-05-05 ENCOUNTER — Other Ambulatory Visit: Payer: Self-pay

## 2023-05-05 ENCOUNTER — Other Ambulatory Visit: Payer: Self-pay | Admitting: Interventional Cardiology

## 2023-05-05 ENCOUNTER — Other Ambulatory Visit (HOSPITAL_COMMUNITY): Payer: Self-pay

## 2023-05-17 ENCOUNTER — Other Ambulatory Visit: Payer: Self-pay

## 2023-05-17 ENCOUNTER — Other Ambulatory Visit (HOSPITAL_COMMUNITY): Payer: Self-pay

## 2023-05-17 MED ORDER — EZETIMIBE 10 MG PO TABS
10.0000 mg | ORAL_TABLET | Freq: Every day | ORAL | 3 refills | Status: DC
  Filled 2023-05-17: qty 90, 90d supply, fill #0
  Filled 2023-08-13: qty 90, 90d supply, fill #1
  Filled 2023-11-09: qty 90, 90d supply, fill #2
  Filled 2024-02-09: qty 90, 90d supply, fill #3

## 2023-05-17 MED ORDER — ROSUVASTATIN CALCIUM 20 MG PO TABS
20.0000 mg | ORAL_TABLET | Freq: Every day | ORAL | 3 refills | Status: DC
  Filled 2023-05-17: qty 90, 90d supply, fill #0
  Filled 2023-08-13: qty 90, 90d supply, fill #1
  Filled 2023-11-09: qty 90, 90d supply, fill #2
  Filled 2024-02-09: qty 90, 90d supply, fill #3

## 2023-05-18 ENCOUNTER — Other Ambulatory Visit (HOSPITAL_COMMUNITY): Payer: Self-pay

## 2023-06-01 ENCOUNTER — Other Ambulatory Visit (HOSPITAL_COMMUNITY): Payer: Self-pay

## 2023-06-01 ENCOUNTER — Other Ambulatory Visit: Payer: Self-pay

## 2023-06-01 MED ORDER — JANUMET XR 50-1000 MG PO TB24
2.0000 | ORAL_TABLET | Freq: Every evening | ORAL | 3 refills | Status: DC
  Filled 2023-06-01: qty 60, 30d supply, fill #0
  Filled 2023-06-30: qty 60, 30d supply, fill #1
  Filled 2023-07-30: qty 60, 30d supply, fill #2

## 2023-06-30 ENCOUNTER — Other Ambulatory Visit: Payer: Self-pay

## 2023-06-30 ENCOUNTER — Other Ambulatory Visit (HOSPITAL_COMMUNITY): Payer: Self-pay

## 2023-07-02 ENCOUNTER — Other Ambulatory Visit (HOSPITAL_COMMUNITY): Payer: Self-pay

## 2023-07-02 MED ORDER — BENAZEPRIL HCL 40 MG PO TABS
40.0000 mg | ORAL_TABLET | Freq: Every day | ORAL | 3 refills | Status: DC
  Filled 2023-07-02: qty 90, 90d supply, fill #0
  Filled 2023-09-29: qty 90, 90d supply, fill #1
  Filled 2023-12-27: qty 90, 90d supply, fill #2
  Filled 2024-03-25: qty 90, 90d supply, fill #3

## 2023-07-12 DIAGNOSIS — L570 Actinic keratosis: Secondary | ICD-10-CM | POA: Diagnosis not present

## 2023-07-30 ENCOUNTER — Other Ambulatory Visit: Payer: Self-pay

## 2023-08-13 ENCOUNTER — Other Ambulatory Visit: Payer: Self-pay

## 2023-08-25 ENCOUNTER — Ambulatory Visit: Payer: 59

## 2023-08-25 VITALS — BP 140/82 | HR 78 | Ht 68.0 in | Wt 177.2 lb

## 2023-08-25 DIAGNOSIS — E782 Mixed hyperlipidemia: Secondary | ICD-10-CM

## 2023-08-25 DIAGNOSIS — I10 Essential (primary) hypertension: Secondary | ICD-10-CM | POA: Diagnosis not present

## 2023-08-25 DIAGNOSIS — I2510 Atherosclerotic heart disease of native coronary artery without angina pectoris: Secondary | ICD-10-CM | POA: Diagnosis not present

## 2023-08-25 DIAGNOSIS — I459 Conduction disorder, unspecified: Secondary | ICD-10-CM | POA: Diagnosis not present

## 2023-08-25 NOTE — Assessment & Plan Note (Addendum)
Good functional status. Regularly exercises.  Reviewed the findings of coronary atherosclerosis and wanted to implies in the long run for overall cardiovascular health and importance of risk factor reduction and pharmacotherapy for long-term risk reduction.  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 On lipid-lowering therapy as below.
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
## 2023-08-25 NOTE — Patient Instructions (Signed)
Medication Instructions:  Your physician recommends that you continue on your current medications as directed. Please refer to the Current Medication list given to you today.  *If you need a refill on your cardiac medications before your next appointment, please call your pharmacy*   Lab Work: None Ordered If you have labs (blood work) drawn today and your tests are completely normal, you will receive your results only by: MyChart Message (if you have MyChart) OR A paper copy in the mail If you have any lab test that is abnormal or we need to change your treatment, we will call you to review the results.   Testing/Procedures: None Ordered   Follow-Up: At [facility], you and your health needs are our priority.  As part of our continuing mission to provide you with exceptional heart care, we have created designated Provider Care Teams.  These Care Teams include your primary Cardiologist (physician) and Advanced Practice Providers (APPs -  Physician Assistants and Nurse Practitioners) who all work together to provide you with the care you need, when you need it.  We recommend signing up for the patient portal called "MyChart".  Sign up information is provided on this After Visit Summary.  MyChart is used to connect with patients for Virtual Visits (Telemedicine).  Patients are able to view lab/test results, encounter notes, upcoming appointments, etc.  Non-urgent messages can be sent to your provider as well.   To learn more about what you can do with MyChart, go to [email or web address].    Your next appointment:   1 year    FDA-cleared personal EKG: The world's most clinically validated personal EKG, FDA-cleared to detect Atrial Fibrillation, Bradycardia, and Tachycardia. Mayford Knife is the most reliable way to check in on your heart from home. Take your EKG from anywhere: Capture a medical-grade EKG in 30 seconds and get an instant analysis right on your smartphone. Mayford Knife is  small enough to fit in your pocket, so you can take it with you anywhere. Easy to use: Simply place your fingers on the sensors--no wires, patches, or gels. Recommended by doctors: A trusted resource, Mayford Knife is the #1 doctor-recommended personal EKG with more than 100 million EKGs recorded. Save or share your EKGs: With the press of a button, email your EKGs to your doctor or save them on your phone. Works with smartphones: Compatible with Event organiser and tablets. Check our compatibility chart. FSA/HSA eligible: Purchase using an FSA or HSA account (please confirm coverage with your insurance provider). Phone clip included with purchase, a $15 value. Conveniently take your device with you wherever you go.  [email or web address]

## 2023-08-25 NOTE — Assessment & Plan Note (Signed)
Last lipid panel from 03/16/2023 total [name] 123, HDL 32, LDL 70, triglycerides 107. Continue Crestor 20 mg once daily [did not tolerate higher dose due to brain fog]. Continue Zetia 10 mg once daily. Continue aggressive management of his underlying diabetes.

## 2023-08-25 NOTE — Assessment & Plan Note (Signed)
Intermittent skipped heartbeat based on manual checking of pulse, asymptomatic.  Infrequent observation for the past couple months.  We reviewed how occasional ectopic beats can occur even in healthy hearts.  Since currently is asymptomatic and these are infrequent episodes when he is manually checking his pulse we reviewed further options to monitor. We discussed home monitoring with Kardia mobile or smart watches or monitoring with Zio patch over 7 to 14 days.  At this time given relative lack of symptoms he is agreeable to monitor at home and let us know if he observes any increase in frequency or symptoms associated with these.

## 2023-08-25 NOTE — Assessment & Plan Note (Signed)
Suboptimal today in the office. Target below 130 over 80 mmHg.  Advised him to keep a log of blood pressure readings once a day for the next couple weeks and if readings are consistently above 130 over 80 mmHg to let us know.  Continue on benazepril 40 mg once daily. If suboptimal, will consider starting low-dose calcium channel blocker.

## 2023-08-26 ENCOUNTER — Other Ambulatory Visit (HOSPITAL_COMMUNITY): Payer: Self-pay

## 2023-09-07 ENCOUNTER — Telehealth: Payer: Self-pay

## 2023-09-07 DIAGNOSIS — I459 Conduction disorder, unspecified: Secondary | ICD-10-CM

## 2023-09-07 DIAGNOSIS — R002 Palpitations: Secondary | ICD-10-CM

## 2023-09-07 NOTE — Telephone Encounter (Signed)
   Pt c/o of Chest Pain: STAT if active CP, including tightness, pressure, jaw pain, radiating pain to shoulder/upper arm/back, CP unrelieved by Nitro. Symptoms reported of SOB, nausea, vomiting, sweating.  1. Are you having CP right now? Yes, feels like muscle soreness in back, neck, & right shoulder    2. Are you experiencing any other symptoms (ex. SOB, nausea, vomiting, sweating)? Woke up to feeling his heart was racing at 3 am this morning.    3. Is your CP continuous or coming and going? Continuous   4. Have you taken Nitroglycerin? No    5. How long have you been experiencing CP? 5 days     6. If NO CP at time of call then end call with telling Pt to call back or call 911 if Chest pain returns prior to return call from triage team.   States he may have overdone it being active, but is unsure. He reports the soreness was worse when he woke up at 3 am this morning with his heart racing.   Call transferred to RN due to being STAT.

## 2023-09-07 NOTE — Telephone Encounter (Signed)
 Patient states that he was in the office two weeks ago, and he did not want to do the heart monitor at that time. He states he woke up at 3 am this morning with his heart racing and had irregular beats. He states that it has stopped and he has walked three miles this morning and his heart rate is in the 70's, and feels regular. He denies any chest pain, but states that he has back and shoulder soreness. Patient reports that he has only had one other episode since he was seen on 08/25/2023. He denies any lightheadedness, dizziness, or shortness of breath. Spoke to Dr. Liborio who recommends patient to wear a zio patch for 14 days. Patient aware and agreeable. Monitor visit scheduled for tomorrow. Advised patient if he had any symptoms to go to the Emergency room. Patient verbalized understanding and had no further questions.

## 2023-09-08 ENCOUNTER — Ambulatory Visit: Payer: 59

## 2023-09-08 DIAGNOSIS — I459 Conduction disorder, unspecified: Secondary | ICD-10-CM

## 2023-09-08 DIAGNOSIS — R002 Palpitations: Secondary | ICD-10-CM

## 2023-09-25 ENCOUNTER — Other Ambulatory Visit (HOSPITAL_COMMUNITY): Payer: Self-pay

## 2023-09-29 ENCOUNTER — Other Ambulatory Visit (HOSPITAL_COMMUNITY): Payer: Self-pay

## 2023-09-30 DIAGNOSIS — I459 Conduction disorder, unspecified: Secondary | ICD-10-CM | POA: Diagnosis not present

## 2023-09-30 DIAGNOSIS — R002 Palpitations: Secondary | ICD-10-CM | POA: Diagnosis not present

## 2023-10-13 DIAGNOSIS — I10 Essential (primary) hypertension: Secondary | ICD-10-CM | POA: Diagnosis not present

## 2023-10-13 DIAGNOSIS — R7989 Other specified abnormal findings of blood chemistry: Secondary | ICD-10-CM | POA: Diagnosis not present

## 2023-10-13 DIAGNOSIS — E785 Hyperlipidemia, unspecified: Secondary | ICD-10-CM | POA: Diagnosis not present

## 2023-10-13 DIAGNOSIS — E039 Hypothyroidism, unspecified: Secondary | ICD-10-CM | POA: Diagnosis not present

## 2023-10-15 IMAGING — CT CT CARDIAC CORONARY ARTERY CALCIUM SCORE
3 series · 14 of 20 positions shown, 16 images · non-contrast
Comparison: None.

CLINICAL DATA: 55-year-old Caucasian male with history of
hyperlipidemia, hypertension and diabetes.

EXAM:
CT CARDIAC CORONARY ARTERY CALCIUM SCORE
TECHNIQUE: Non-contrast imaging through the heart was performed using
prospective ECG gating. Image post processing was performed on an
independent workstation, allowing for quantitative analysis of the
heart and coronary arteries. Note that this exam targets the heart
and the chest was not imaged in its entirety.

[Series 2: calcium scoring 2.00 qr36 bestdiast 70% hrt calciu · axial · 0.39mm/px · z∈[+1697,+1781]mm · 4 of 70 slices shown]
[im 14/70  vessel]
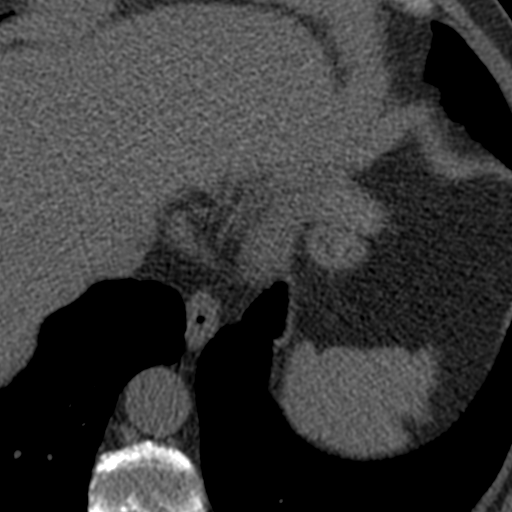
[im 28/70  vessel]
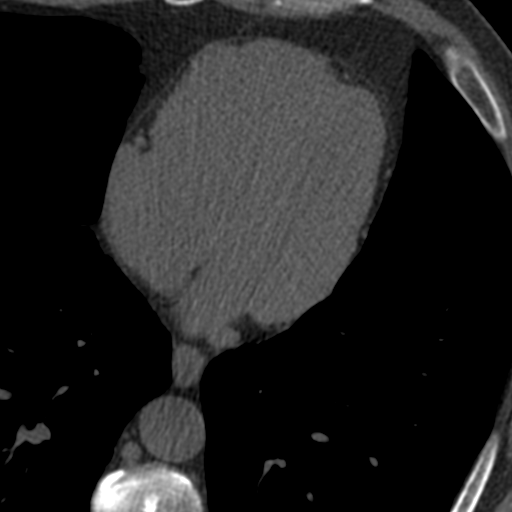
[im 42/70  vessel]
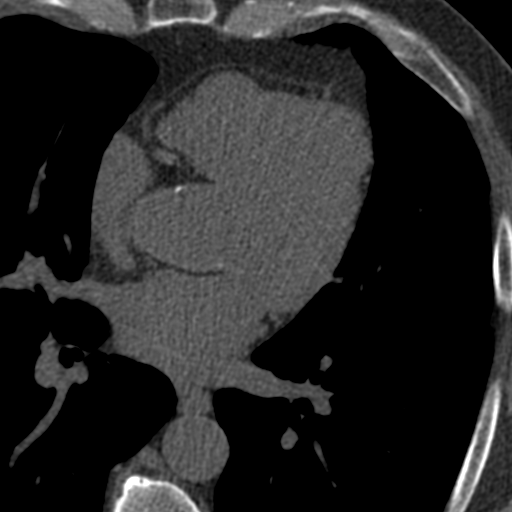
[im 56/70  vessel]
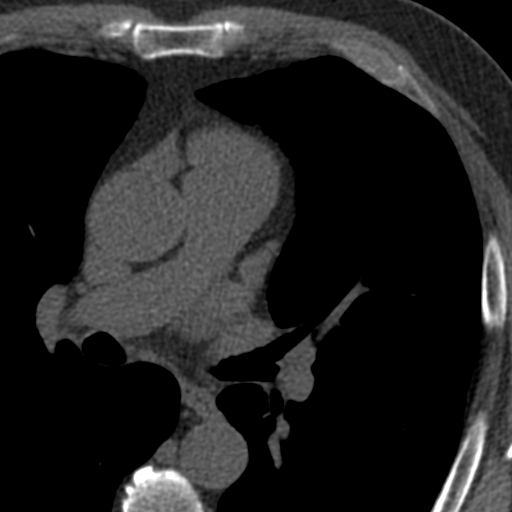

[Series 3: calcium scoring 2.00 br40 bestdiast 70% axial · axial · 0.64mm/px · z∈[+1693,+1785]mm · 5 of 70 slices shown, 7 images]
[im 12/70  vessel]
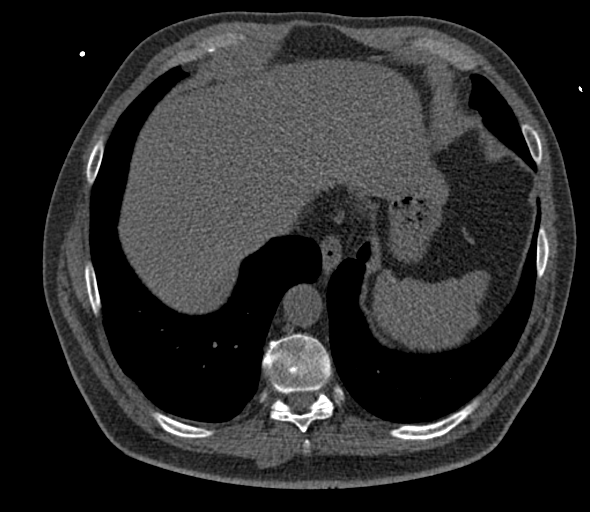
[im 12/70  lung]
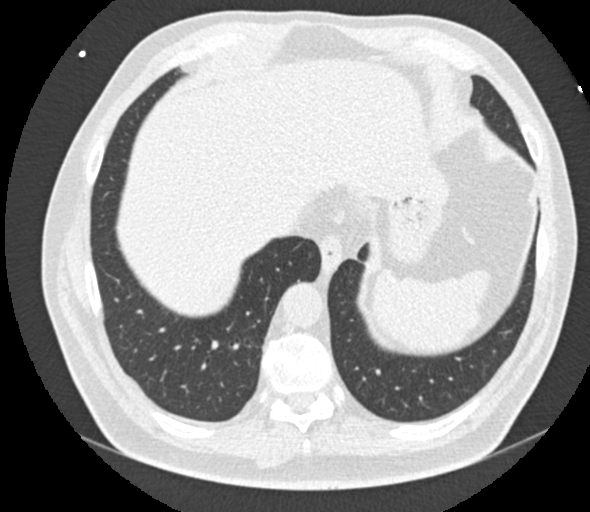
[im 24/70  vessel]
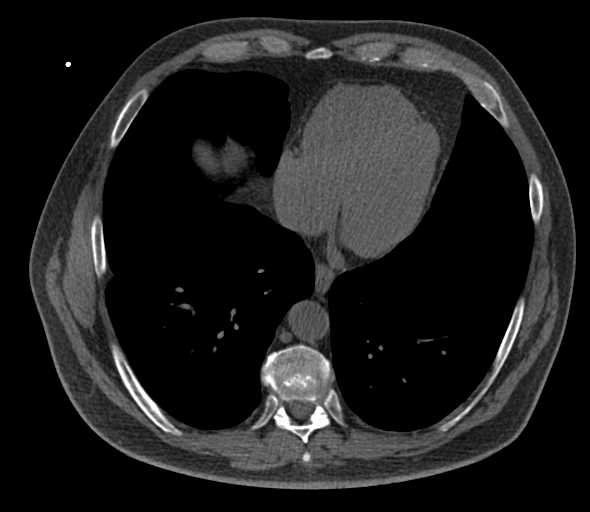
[im 35/70  vessel]
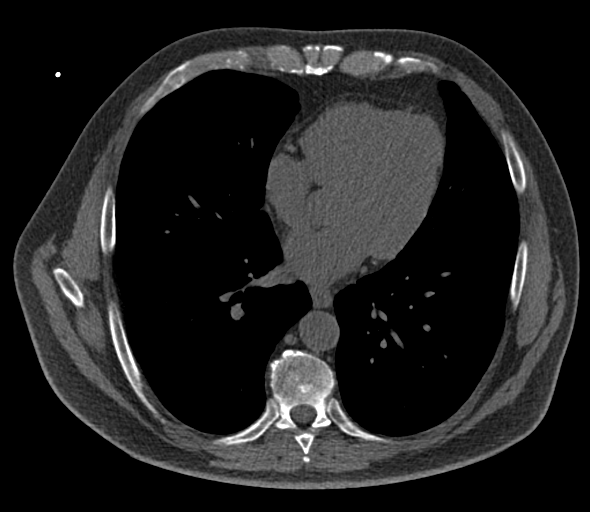
[im 47/70  vessel]
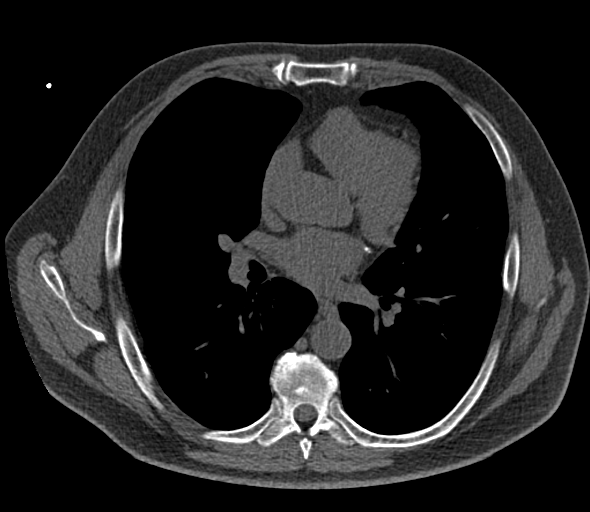
[im 58/70  vessel]
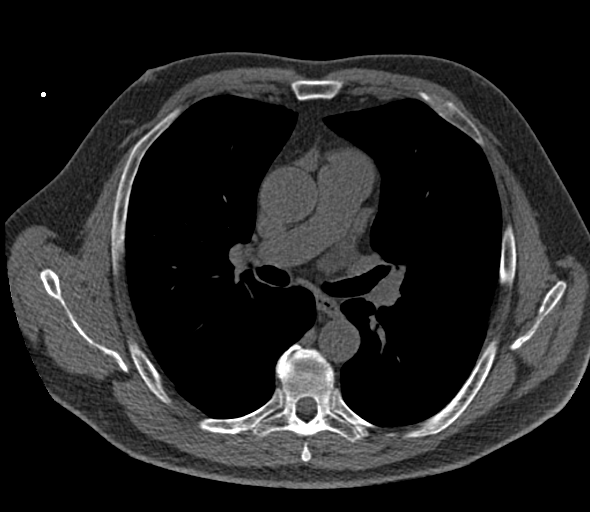
[im 58/70  lung]
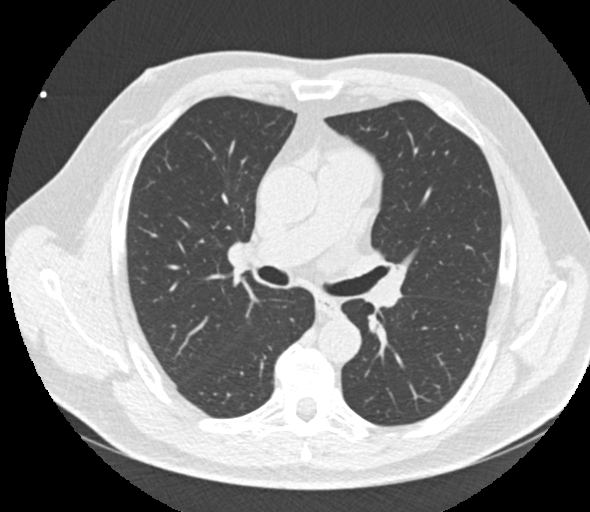

[Series 9: calcium scoring 2.00 br60 bestdiast 70% lungs · axial · 0.64mm/px · z∈[+1693,+1785]mm · 5 of 70 slices shown]
[im 12/70  vessel]
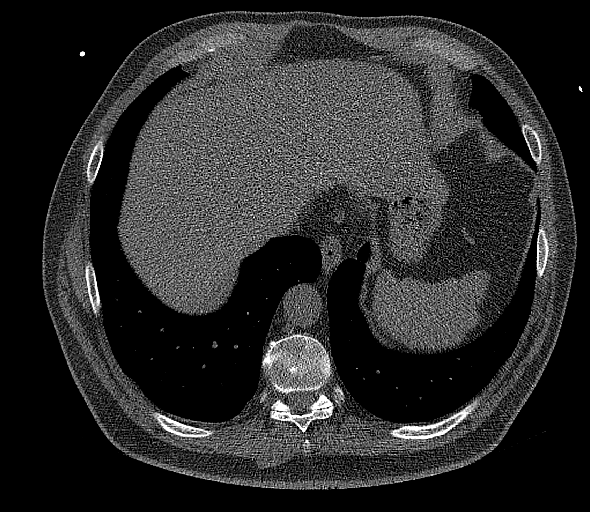
[im 24/70  vessel]
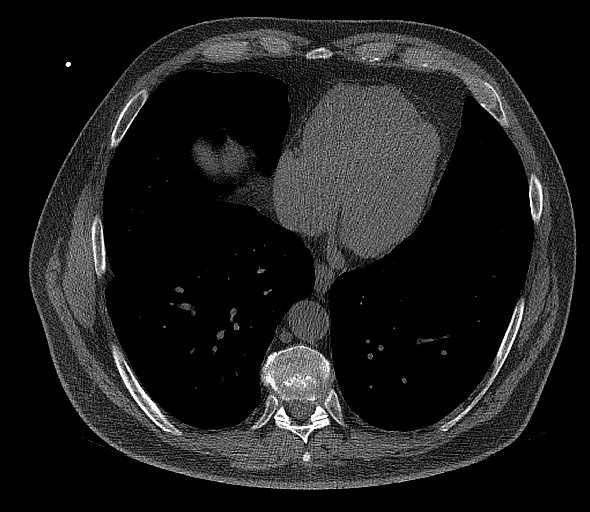
[im 35/70  vessel]
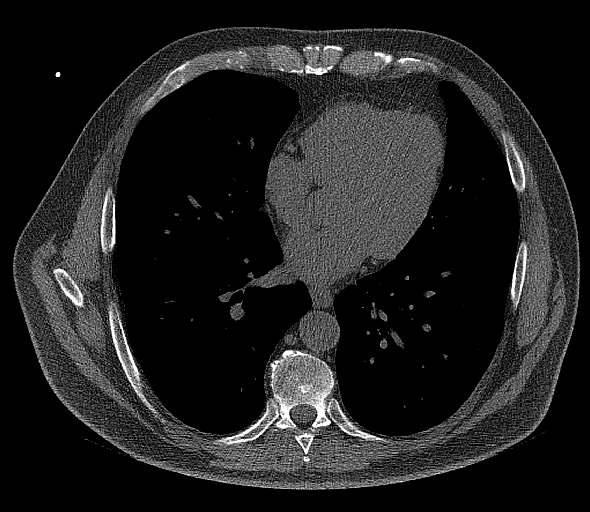
[im 47/70  vessel]
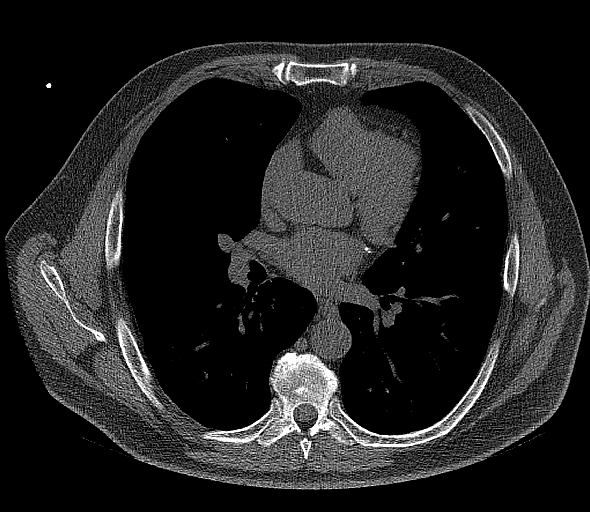
[im 58/70  vessel]
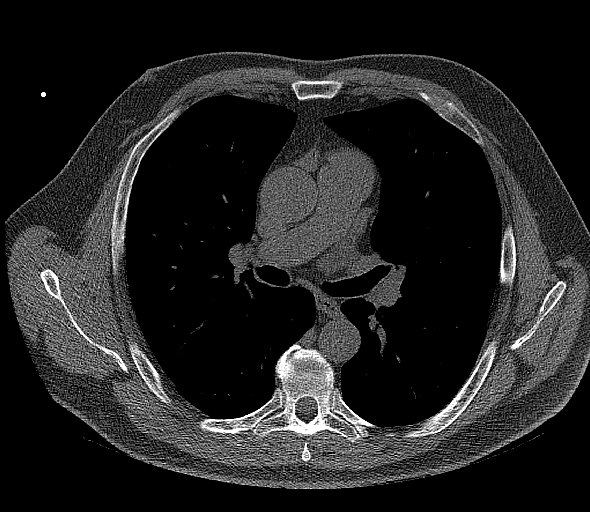

[14 of 20 positions shown; findings below may reference images not displayed]

FINDINGS: CORONARY CALCIUM SCORES:

Left Main:

LAD: 516

LCx: 150

RCA:

Total Agatston Score: 763

[HOSPITAL] percentile: 97

AORTA MEASUREMENTS:

Ascending Aorta: 35 mm

Descending Aorta: 25 mm

OTHER FINDINGS:

The heart size is within normal limits. No pericardial fluid is
identified. Visualized segments of the thoracic aorta and central
pulmonary arteries are normal in caliber. Visualized mediastinum and
hilar regions demonstrate no lymphadenopathy or masses. Visualized
lungs show no evidence of pulmonary edema, consolidation,
pneumothorax, nodule or pleural fluid. Visualized upper abdomen and
bony structures are unremarkable.
IMPRESSION: Coronary calcium score of 763 is at the 97th percentile for the
patient's age, sex and race.

## 2023-10-25 ENCOUNTER — Other Ambulatory Visit (HOSPITAL_COMMUNITY): Payer: Self-pay

## 2023-11-01 DIAGNOSIS — I459 Conduction disorder, unspecified: Secondary | ICD-10-CM | POA: Diagnosis not present

## 2023-11-01 DIAGNOSIS — I10 Essential (primary) hypertension: Secondary | ICD-10-CM

## 2023-11-01 DIAGNOSIS — R002 Palpitations: Secondary | ICD-10-CM

## 2023-11-08 ENCOUNTER — Other Ambulatory Visit (HOSPITAL_COMMUNITY): Payer: Self-pay

## 2023-11-08 ENCOUNTER — Other Ambulatory Visit: Payer: Self-pay

## 2023-11-08 MED ORDER — ELIQUIS 2.5 MG PO TABS
2.5000 mg | ORAL_TABLET | Freq: Two times a day (BID) | ORAL | 1 refills | Status: DC
  Filled 2023-11-08: qty 180, 90d supply, fill #0
  Filled 2024-02-09: qty 180, 90d supply, fill #1

## 2023-11-09 ENCOUNTER — Other Ambulatory Visit (HOSPITAL_COMMUNITY): Payer: Self-pay

## 2023-11-09 ENCOUNTER — Other Ambulatory Visit: Payer: Self-pay

## 2023-11-19 ENCOUNTER — Telehealth: Payer: Self-pay

## 2023-11-19 ENCOUNTER — Ambulatory Visit

## 2023-11-19 DIAGNOSIS — I10 Essential (primary) hypertension: Secondary | ICD-10-CM | POA: Diagnosis not present

## 2023-11-19 LAB — ECHOCARDIOGRAM COMPLETE
Area-P 1/2: 4.31 cm2
S' Lateral: 3.3 cm

## 2023-11-19 NOTE — Telephone Encounter (Signed)
 Advised that Dr. Ronell Coe will review BP log upon his return 11/22/23.

## 2023-11-19 NOTE — Telephone Encounter (Signed)
 Patient would like to start taking the Metoprolol, he said him and Dr. Ronell Coe discussed possibly taking it at his last appointment back in January. He uses the [facility] outpatient pharmacy. CB # [phone]

## 2023-11-22 ENCOUNTER — Other Ambulatory Visit (HOSPITAL_COMMUNITY): Payer: Self-pay

## 2023-11-23 DIAGNOSIS — M542 Cervicalgia: Secondary | ICD-10-CM | POA: Diagnosis not present

## 2023-11-23 DIAGNOSIS — I2510 Atherosclerotic heart disease of native coronary artery without angina pectoris: Secondary | ICD-10-CM | POA: Diagnosis not present

## 2023-11-23 DIAGNOSIS — I2699 Other pulmonary embolism without acute cor pulmonale: Secondary | ICD-10-CM | POA: Diagnosis not present

## 2023-11-23 DIAGNOSIS — E785 Hyperlipidemia, unspecified: Secondary | ICD-10-CM | POA: Diagnosis not present

## 2023-11-23 DIAGNOSIS — J309 Allergic rhinitis, unspecified: Secondary | ICD-10-CM | POA: Diagnosis not present

## 2023-11-23 DIAGNOSIS — I471 Supraventricular tachycardia, unspecified: Secondary | ICD-10-CM | POA: Diagnosis not present

## 2023-11-23 DIAGNOSIS — I459 Conduction disorder, unspecified: Secondary | ICD-10-CM | POA: Diagnosis not present

## 2023-11-23 DIAGNOSIS — E1169 Type 2 diabetes mellitus with other specified complication: Secondary | ICD-10-CM | POA: Diagnosis not present

## 2023-11-23 DIAGNOSIS — G4733 Obstructive sleep apnea (adult) (pediatric): Secondary | ICD-10-CM | POA: Diagnosis not present

## 2023-11-23 DIAGNOSIS — I10 Essential (primary) hypertension: Secondary | ICD-10-CM | POA: Diagnosis not present

## 2023-11-29 NOTE — Telephone Encounter (Signed)
 Called the patient and informed him of Dr. Madireddy's recommendation below, regarding his blood pressure log:  "Reviewed blood pressure log from home.  Systolic blood pressure ranging between 110s to 120s and diastolic blood pressure from 60s to 80s [predominantly in the 70s].  Overall good blood pressure control.  No changes to medication at this time."  Patient verbalized understanding and had no further questions at this time.

## 2023-11-29 NOTE — Telephone Encounter (Signed)
 Left message for the patient to call back.

## 2023-12-07 ENCOUNTER — Other Ambulatory Visit (HOSPITAL_COMMUNITY): Payer: Self-pay

## 2023-12-08 ENCOUNTER — Encounter: Payer: Self-pay | Admitting: Pharmacist

## 2023-12-08 ENCOUNTER — Other Ambulatory Visit: Payer: Self-pay

## 2023-12-08 ENCOUNTER — Other Ambulatory Visit (HOSPITAL_COMMUNITY): Payer: Self-pay

## 2023-12-08 MED ORDER — JANUMET XR 50-1000 MG PO TB24
2.0000 | ORAL_TABLET | Freq: Every day | ORAL | 3 refills | Status: AC
  Filled 2023-12-08 (×2): qty 180, 90d supply, fill #0
  Filled 2024-03-03: qty 180, 90d supply, fill #1
  Filled 2024-06-01: qty 180, 90d supply, fill #2
  Filled 2024-08-30: qty 180, 90d supply, fill #3

## 2023-12-27 ENCOUNTER — Other Ambulatory Visit (HOSPITAL_COMMUNITY): Payer: Self-pay

## 2024-01-26 DIAGNOSIS — D2372 Other benign neoplasm of skin of left lower limb, including hip: Secondary | ICD-10-CM | POA: Diagnosis not present

## 2024-01-26 DIAGNOSIS — L308 Other specified dermatitis: Secondary | ICD-10-CM | POA: Diagnosis not present

## 2024-01-26 DIAGNOSIS — L821 Other seborrheic keratosis: Secondary | ICD-10-CM | POA: Diagnosis not present

## 2024-01-26 DIAGNOSIS — L814 Other melanin hyperpigmentation: Secondary | ICD-10-CM | POA: Diagnosis not present

## 2024-01-26 DIAGNOSIS — Z872 Personal history of diseases of the skin and subcutaneous tissue: Secondary | ICD-10-CM | POA: Diagnosis not present

## 2024-02-10 ENCOUNTER — Other Ambulatory Visit: Payer: Self-pay

## 2024-02-28 ENCOUNTER — Other Ambulatory Visit (HOSPITAL_COMMUNITY): Payer: Self-pay

## 2024-02-29 ENCOUNTER — Other Ambulatory Visit (HOSPITAL_COMMUNITY): Payer: Self-pay

## 2024-02-29 ENCOUNTER — Other Ambulatory Visit: Payer: Self-pay

## 2024-02-29 MED ORDER — JARDIANCE 25 MG PO TABS
25.0000 mg | ORAL_TABLET | Freq: Every day | ORAL | 11 refills | Status: AC
  Filled 2024-02-29: qty 30, 30d supply, fill #0
  Filled 2024-03-25: qty 30, 30d supply, fill #1
  Filled 2024-04-26: qty 30, 30d supply, fill #2
  Filled 2024-05-25: qty 30, 30d supply, fill #3
  Filled 2024-06-20: qty 30, 30d supply, fill #4
  Filled 2024-06-20: qty 30, 30d supply, fill #0
  Filled 2024-07-20 (×2): qty 30, 30d supply, fill #1
  Filled 2024-08-23: qty 30, 30d supply, fill #2

## 2024-03-03 ENCOUNTER — Other Ambulatory Visit: Payer: Self-pay

## 2024-03-25 ENCOUNTER — Other Ambulatory Visit (HOSPITAL_COMMUNITY): Payer: Self-pay

## 2024-04-18 DIAGNOSIS — H3554 Dystrophies primarily involving the retinal pigment epithelium: Secondary | ICD-10-CM | POA: Diagnosis not present

## 2024-04-18 DIAGNOSIS — E119 Type 2 diabetes mellitus without complications: Secondary | ICD-10-CM | POA: Diagnosis not present

## 2024-04-18 DIAGNOSIS — H35033 Hypertensive retinopathy, bilateral: Secondary | ICD-10-CM | POA: Diagnosis not present

## 2024-04-18 DIAGNOSIS — H2513 Age-related nuclear cataract, bilateral: Secondary | ICD-10-CM | POA: Diagnosis not present

## 2024-04-26 ENCOUNTER — Other Ambulatory Visit (HOSPITAL_COMMUNITY): Payer: Self-pay

## 2024-05-04 ENCOUNTER — Other Ambulatory Visit: Payer: Self-pay

## 2024-05-04 ENCOUNTER — Other Ambulatory Visit (HOSPITAL_COMMUNITY): Payer: Self-pay

## 2024-05-04 MED ORDER — ROSUVASTATIN CALCIUM 20 MG PO TABS
20.0000 mg | ORAL_TABLET | Freq: Every day | ORAL | 3 refills | Status: AC
  Filled 2024-05-04: qty 90, 90d supply, fill #0
  Filled 2024-08-23: qty 90, 90d supply, fill #1

## 2024-05-04 MED ORDER — ELIQUIS 2.5 MG PO TABS
2.5000 mg | ORAL_TABLET | Freq: Two times a day (BID) | ORAL | 1 refills | Status: AC
  Filled 2024-05-04: qty 180, 90d supply, fill #0
  Filled 2024-08-23: qty 180, 90d supply, fill #1

## 2024-05-04 MED ORDER — EZETIMIBE 10 MG PO TABS
10.0000 mg | ORAL_TABLET | Freq: Every day | ORAL | 3 refills | Status: AC
  Filled 2024-05-04: qty 90, 90d supply, fill #0
  Filled 2024-08-23: qty 90, 90d supply, fill #1

## 2024-05-26 ENCOUNTER — Other Ambulatory Visit: Payer: Self-pay

## 2024-06-01 ENCOUNTER — Other Ambulatory Visit: Payer: Self-pay

## 2024-06-12 DIAGNOSIS — Z125 Encounter for screening for malignant neoplasm of prostate: Secondary | ICD-10-CM | POA: Diagnosis not present

## 2024-06-12 DIAGNOSIS — E1169 Type 2 diabetes mellitus with other specified complication: Secondary | ICD-10-CM | POA: Diagnosis not present

## 2024-06-12 DIAGNOSIS — R946 Abnormal results of thyroid function studies: Secondary | ICD-10-CM | POA: Diagnosis not present

## 2024-06-12 DIAGNOSIS — E785 Hyperlipidemia, unspecified: Secondary | ICD-10-CM | POA: Diagnosis not present

## 2024-06-12 DIAGNOSIS — M810 Age-related osteoporosis without current pathological fracture: Secondary | ICD-10-CM | POA: Diagnosis not present

## 2024-06-12 DIAGNOSIS — I10 Essential (primary) hypertension: Secondary | ICD-10-CM | POA: Diagnosis not present

## 2024-06-19 DIAGNOSIS — R82998 Other abnormal findings in urine: Secondary | ICD-10-CM | POA: Diagnosis not present

## 2024-06-19 DIAGNOSIS — R809 Proteinuria, unspecified: Secondary | ICD-10-CM | POA: Diagnosis not present

## 2024-06-19 DIAGNOSIS — G4733 Obstructive sleep apnea (adult) (pediatric): Secondary | ICD-10-CM | POA: Diagnosis not present

## 2024-06-19 DIAGNOSIS — E785 Hyperlipidemia, unspecified: Secondary | ICD-10-CM | POA: Diagnosis not present

## 2024-06-19 DIAGNOSIS — E1169 Type 2 diabetes mellitus with other specified complication: Secondary | ICD-10-CM | POA: Diagnosis not present

## 2024-06-19 DIAGNOSIS — I10 Essential (primary) hypertension: Secondary | ICD-10-CM | POA: Diagnosis not present

## 2024-06-19 DIAGNOSIS — I2510 Atherosclerotic heart disease of native coronary artery without angina pectoris: Secondary | ICD-10-CM | POA: Diagnosis not present

## 2024-06-19 DIAGNOSIS — Z0000 Encounter for general adult medical examination without abnormal findings: Secondary | ICD-10-CM | POA: Diagnosis not present

## 2024-06-19 DIAGNOSIS — I2699 Other pulmonary embolism without acute cor pulmonale: Secondary | ICD-10-CM | POA: Diagnosis not present

## 2024-06-19 DIAGNOSIS — M810 Age-related osteoporosis without current pathological fracture: Secondary | ICD-10-CM | POA: Diagnosis not present

## 2024-06-19 DIAGNOSIS — N5089 Other specified disorders of the male genital organs: Secondary | ICD-10-CM | POA: Diagnosis not present

## 2024-06-20 ENCOUNTER — Other Ambulatory Visit (HOSPITAL_COMMUNITY): Payer: Self-pay

## 2024-06-20 ENCOUNTER — Other Ambulatory Visit: Payer: Self-pay

## 2024-06-20 MED ORDER — BENAZEPRIL HCL 40 MG PO TABS
40.0000 mg | ORAL_TABLET | Freq: Every day | ORAL | 3 refills | Status: AC
  Filled 2024-06-20: qty 90, 90d supply, fill #0

## 2024-06-22 ENCOUNTER — Other Ambulatory Visit: Payer: Self-pay

## 2024-06-27 ENCOUNTER — Other Ambulatory Visit: Payer: Self-pay

## 2024-07-03 ENCOUNTER — Other Ambulatory Visit (HOSPITAL_COMMUNITY): Payer: Self-pay

## 2024-07-03 MED ORDER — IBANDRONATE SODIUM 150 MG PO TABS
150.0000 mg | ORAL_TABLET | ORAL | 3 refills | Status: AC
  Filled 2024-07-03: qty 3, 90d supply, fill #0

## 2024-07-04 ENCOUNTER — Other Ambulatory Visit (HOSPITAL_COMMUNITY): Payer: Self-pay

## 2024-07-20 ENCOUNTER — Other Ambulatory Visit: Payer: Self-pay

## 2024-07-24 ENCOUNTER — Other Ambulatory Visit: Payer: Self-pay

## 2024-08-23 ENCOUNTER — Other Ambulatory Visit (HOSPITAL_COMMUNITY): Payer: Self-pay

## 2024-08-30 ENCOUNTER — Other Ambulatory Visit (HOSPITAL_COMMUNITY): Payer: Self-pay

## 2024-08-30 ENCOUNTER — Encounter: Payer: Self-pay | Admitting: Pharmacist

## 2024-08-30 ENCOUNTER — Other Ambulatory Visit: Payer: Self-pay

## 2024-08-30 MED ORDER — METRONIDAZOLE 0.75 % EX GEL
1.0000 | Freq: Every day | CUTANEOUS | 1 refills | Status: AC
  Filled 2024-08-30: qty 45, 30d supply, fill #0

## 2024-08-31 ENCOUNTER — Other Ambulatory Visit: Payer: Self-pay
